# Patient Record
Sex: Male | Born: 1950 | Race: White | Hispanic: No | Marital: Married | Smoking: Never smoker
Health system: Southern US, Community
[De-identification: ages and names within clinical notes are randomized; demographics above are authoritative.]

## PROBLEM LIST (undated history)

## (undated) DIAGNOSIS — M1990 Unspecified osteoarthritis, unspecified site: Secondary | ICD-10-CM

## (undated) DIAGNOSIS — K635 Polyp of colon: Secondary | ICD-10-CM

## (undated) DIAGNOSIS — I10 Essential (primary) hypertension: Secondary | ICD-10-CM

## (undated) DIAGNOSIS — H269 Unspecified cataract: Secondary | ICD-10-CM

## (undated) DIAGNOSIS — I219 Acute myocardial infarction, unspecified: Secondary | ICD-10-CM

## (undated) DIAGNOSIS — C4492 Squamous cell carcinoma of skin, unspecified: Secondary | ICD-10-CM

## (undated) DIAGNOSIS — G4730 Sleep apnea, unspecified: Secondary | ICD-10-CM

## (undated) DIAGNOSIS — Z8614 Personal history of Methicillin resistant Staphylococcus aureus infection: Secondary | ICD-10-CM

## (undated) DIAGNOSIS — K649 Unspecified hemorrhoids: Secondary | ICD-10-CM

## (undated) HISTORY — PX: EYE SURGERY: SHX253

## (undated) HISTORY — DX: Sleep apnea, unspecified: G47.30

## (undated) HISTORY — DX: Unspecified osteoarthritis, unspecified site: M19.90

## (undated) HISTORY — DX: Unspecified cataract: H26.9

## (undated) HISTORY — DX: Unspecified hemorrhoids: K64.9

## (undated) HISTORY — DX: Essential (primary) hypertension: I10

## (undated) HISTORY — PX: KNEE SURGERY: SHX244

## (undated) HISTORY — DX: Personal history of Methicillin resistant Staphylococcus aureus infection: Z86.14

## (undated) HISTORY — DX: Squamous cell carcinoma of skin, unspecified: C44.92

## (undated) HISTORY — DX: Polyp of colon: K63.5

## (undated) HISTORY — PX: HERNIA REPAIR: SHX51

## (undated) HISTORY — PX: POLYPECTOMY: SHX149

## (undated) HISTORY — DX: Acute myocardial infarction, unspecified: I21.9

---

## 2005-04-16 HISTORY — PX: COLONOSCOPY: SHX174

## 2005-08-08 ENCOUNTER — Ambulatory Visit: Payer: Self-pay | Admitting: Internal Medicine

## 2005-08-15 ENCOUNTER — Ambulatory Visit: Payer: Self-pay | Admitting: Internal Medicine

## 2008-09-07 DIAGNOSIS — C4491 Basal cell carcinoma of skin, unspecified: Secondary | ICD-10-CM

## 2008-09-07 HISTORY — DX: Basal cell carcinoma of skin, unspecified: C44.91

## 2010-03-28 DIAGNOSIS — D239 Other benign neoplasm of skin, unspecified: Secondary | ICD-10-CM

## 2010-03-28 HISTORY — DX: Other benign neoplasm of skin, unspecified: D23.9

## 2012-08-08 ENCOUNTER — Encounter: Payer: Self-pay | Admitting: Internal Medicine

## 2014-11-12 ENCOUNTER — Ambulatory Visit
Admission: EM | Admit: 2014-11-12 | Discharge: 2014-11-12 | Disposition: A | Discharge status: Home / Self Care | Payer: Worker's Compensation | Attending: Emergency Medicine | Admitting: Emergency Medicine

## 2014-11-12 ENCOUNTER — Ambulatory Visit: Payer: Worker's Compensation

## 2014-11-12 DIAGNOSIS — S161XXA Strain of muscle, fascia and tendon at neck level, initial encounter: Secondary | ICD-10-CM | POA: Insufficient documentation

## 2014-11-12 DIAGNOSIS — M542 Cervicalgia: Secondary | ICD-10-CM | POA: Diagnosis present
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
## 2014-11-12 NOTE — Discharge Instructions (Signed)
500 mg aleve twice a day as needed. Gentle ranger of motion exercises, massage as we discussed. Return if you get worse or if not better in 6 weeks after the injury.
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
## 2015-05-23 ENCOUNTER — Telehealth: Payer: Self-pay | Admitting: Internal Medicine

## 2015-05-23 NOTE — Telephone Encounter (Signed)
Spoke with patient and he would like Dr. Loletha Carrow as his new GI. He reports rectal pain for some time now. He drives a lot and it is painful. Offered OV tomorrow with extender but he cannot come. Scheduled with Arta Bruce, PA-C on 06/10/15 at 2:45 PM. Patient understands to seek care at PCP, Urgent Care or ED for urgent needs.

## 2015-05-23 NOTE — Telephone Encounter (Signed)
Left a message for patient to call back. 

## 2015-06-09 ENCOUNTER — Encounter: Payer: Self-pay | Admitting: Gastroenterology

## 2015-06-10 ENCOUNTER — Encounter: Payer: Self-pay | Admitting: Physician Assistant

## 2015-06-10 ENCOUNTER — Ambulatory Visit (INDEPENDENT_AMBULATORY_CARE_PROVIDER_SITE_OTHER): Payer: Managed Care, Other (non HMO) | Admitting: Physician Assistant

## 2015-06-10 VITALS — BP 146/84 | Ht 73.0 in | Wt 206.0 lb

## 2015-06-10 DIAGNOSIS — K648 Other hemorrhoids: Secondary | ICD-10-CM

## 2015-06-10 DIAGNOSIS — Z86010 Personal history of colonic polyps: Secondary | ICD-10-CM

## 2015-06-10 MED ORDER — HYDROCORTISONE ACETATE 25 MG RE SUPP: 25.0000 mg | Freq: Two times a day (BID) | RECTAL | Status: DC

## 2015-06-10 NOTE — Progress Notes (Signed)
Thank you for sending this case to me. I have reviewed the entire note, and the outlined plan seems appropriate.  

## 2015-06-10 NOTE — Patient Instructions (Signed)
You have been scheduled for a colonoscopy. Please follow written instructions given to you at your visit today.  Please pick up your prep supplies at the pharmacy within the next 1-3 days. If you use inhalers (even only as needed), please bring them with you on the day of your procedure. Your physician has requested that you go to www.startemmi.com and enter the access code given to you at your visit today. This web site gives a general overview about your procedure. However, you should still follow specific instructions given to you by our office regarding your preparation for the procedure.  We sent a prescription to your pharmacy [facility] suppositories.

## 2015-06-10 NOTE — Progress Notes (Signed)
Patient ID: James Moore, male   DOB: 01-12-1951, 65 y.o.   MRN: [id number]    HPI:  James Moore is a 65 y.o.   male  Who was previously followed by Dr. Delfin Edis. He had a screening colonoscopy in May 2007 at which time an ascending  Colon polyp measuring 3 mm was found. He was noted to have internal hemorrhoids as well. Pathology of the polyp revealed benign colonic mucosa. Patient was advised to have surveillance in 10 years. He recently received a call that he is due to schedule his surveillance colonoscopy.    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 He will often have to strain to start a bowel movement. Over the last few months he has had episodes of rectal burning. He has had no bright red blood per rectum or melena. His appetite is good and his weight is been stable. He denies a family history of colon cancer or colon polyps but states his mother had Crohn's disease that required innumerable surgeries.   Past Medical History  Diagnosis Date  . Colon polyps     Past Surgical History  Procedure Laterality Date  . Knee surgery     Family History  Problem Relation Age of Onset  . Crohn's disease Mother    Social History  Substance Use Topics  . Smoking status: Never Smoker   . Smokeless tobacco: None  . Alcohol Use: Yes     Comment: social   No current outpatient prescriptions on file.   No current facility-administered medications for this visit.   No Known Allergies   Review of Systems: Gen: Denies any fever, chills, sweats, anorexia, fatigue, weakness, malaise, weight loss, and sleep disorder CV: Denies chest pain, angina, palpitations, syncope, orthopnea, PND, peripheral edema, and claudication. Resp: Denies dyspnea at rest, dyspnea with exercise, cough, sputum, wheezing, coughing up blood, and pleurisy. GI: Denies vomiting blood, jaundice, and fecal incontinence.   Denies dysphagia or  odynophagia. GU : Denies urinary burning, blood in urine, urinary frequency, urinary hesitancy, nocturnal urination, and urinary incontinence. MS: Denies joint pain, limitation of movement, and swelling, stiffness, low back pain, extremity pain. Denies muscle weakness, cramps, atrophy.  Derm: Denies rash, itching, dry skin, hives, moles, warts, or unhealing ulcers.  Psych: Denies depression, anxiety, memory loss, suicidal ideation, hallucinations, paranoia, and confusion. Heme: Denies bruising, bleeding, and enlarged lymph nodes. Neuro:  Denies any headaches, dizziness, paresthesias. Endo:  Denies any problems with DM, thyroid, adrenal function    Prior Endoscopies:   See history of present illness  Physical Exam: BP 146/84 mmHg  Ht 6\' 1"  (1.854 m)  Wt 206 lb (93.441 kg)  BMI 27.18 kg/m2 Constitutional: Pleasant,well-developed, male in no acute distress. HEENT: Normocephalic and atraumatic. Conjunctivae are normal. No scleral icterus. Neck supple.  No JVD Cardiovascular: Normal rate, regular rhythm.  Pulmonary/chest: Effort normal and breath sounds normal. No wheezing, rales or rhonchi. Abdominal: Soft, nondistended, nontender. Bowel sounds active throughout. There are no masses palpable. No hepatomegaly. Rectal:  No external hemorrhoids noted. Brown stool, Hemoccult negative. Anoscopy with internal hemorrhoids. Extremities: no edema Lymphadenopathy: No cervical adenopathy noted. Neurological: Alert and oriented to person place and time. Skin: Skin is warm and dry. No rashes noted. Psychiatric: Normal mood and affect. Behavior is normal.  ASSESSMENT AND PLAN:   65 year old male due for surveillance colonoscopy presenting with complaints of intermittent anal burning. Symptoms are  likely due to his internal hemorrhoids. He's been advised to use a stool softener as needed. He will a trial of Anusol HC suppositories 1 per rectum twice daily for 10 days. He will be scheduled for  colonoscopy to evaluate for polyps, neoplasia, IBD, etc.The risks, benefits, and alternatives to colonoscopy with possible biopsy and possible polypectomy were discussed with the patient and they consent to proceed.   Further recommendations will be made pending the findings of the above.   Ianmichael Amescua, Vita Barley PA-C 06/10/2015, 3:29 PM

## 2015-06-17 ENCOUNTER — Ambulatory Visit (INDEPENDENT_AMBULATORY_CARE_PROVIDER_SITE_OTHER): Payer: Managed Care, Other (non HMO) | Admitting: Family Medicine

## 2015-06-17 ENCOUNTER — Encounter: Payer: Self-pay | Admitting: Family Medicine

## 2015-06-17 VITALS — BP 138/82 | HR 66 | Temp 98.7°F | Resp 16 | Ht 72.0 in | Wt 210.2 lb

## 2015-06-17 DIAGNOSIS — Z8614 Personal history of Methicillin resistant Staphylococcus aureus infection: Secondary | ICD-10-CM | POA: Insufficient documentation

## 2015-06-17 DIAGNOSIS — Z23 Encounter for immunization: Secondary | ICD-10-CM | POA: Diagnosis not present

## 2015-06-17 DIAGNOSIS — M5020 Other cervical disc displacement, unspecified cervical region: Secondary | ICD-10-CM | POA: Insufficient documentation

## 2015-06-17 DIAGNOSIS — C44310 Basal cell carcinoma of skin of unspecified parts of face: Secondary | ICD-10-CM | POA: Insufficient documentation

## 2015-06-17 DIAGNOSIS — M5030 Other cervical disc degeneration, unspecified cervical region: Secondary | ICD-10-CM | POA: Insufficient documentation

## 2015-06-17 DIAGNOSIS — Z0000 Encounter for general adult medical examination without abnormal findings: Secondary | ICD-10-CM | POA: Diagnosis not present

## 2015-06-17 NOTE — Patient Instructions (Signed)
We will call you with the lab results. 

## 2015-06-17 NOTE — Progress Notes (Signed)
Subjective:     Patient ID: James Moore, male   DOB: [birth year], 65 y.o.   MRN: [id number]  HPI  Chief Complaint  Patient presents with  . Annual Exam    Patient comes into office today for his annual physical, patient would like to address rectal bleeding intermittent for the past month. He is scheduled for a colonoscopy on 3/15, after being seen by [facility] it was determined that patient has a hemrrhoid.   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   Review of Systems General: Feeling well, unknown last tetanus shot; defers Zostavax. HEENT: regular dental visits and eye exams Cardiovascular: no chest pain, shortness of breath, or palpitations Pulmonary: hx of OSA with CPAP in 2009 but lost a lot of weight and no longer needs. GI: no heartburn, no change in bowel habits. Screening colonoscopy pending GU: nocturia x0- 1, no change in bladder habits. Reports getting flaccid halfway through intercourse. Psychiatric: not depressed Musculoskeletal: no joint pain. History of left partial knee meniscectomy Integument: sees dermatology, Dr. Nehemiah Massed, on a regular basis.    Objective:   Physical Exam  Constitutional: He appears well-developed and well-nourished. No distress.  Eyes: PERRLA, EOMI Neck: no thyromegaly, tenderness or nodules, carotids without bruit.  ENT: TM's intact without inflammation; No tonsillar enlargement or exudate, Lungs: Clear Heart : RRR without murmur or gallop Abd: bowel sounds present, soft, non-tender, no organomegaly Rectal: Prostate firm and non-tender, brown stool on glove Extremities: no edema     Assessment:    1. Annual physical exam - PSA - Lipid panel - Comprehensive metabolic panel  2. Need for diphtheria-tetanus-pertussis (Tdap) vaccine, adult/adolescent - Tdap vaccine greater than or equal to 7yo IM      Plan:    Further f/u pending lab work. Consider Viagra for  erectile dysfunction.

## 2015-06-21 ENCOUNTER — Telehealth: Payer: Self-pay

## 2015-06-21 ENCOUNTER — Other Ambulatory Visit: Payer: Self-pay | Admitting: Family Medicine

## 2015-06-21 DIAGNOSIS — N529 Male erectile dysfunction, unspecified: Secondary | ICD-10-CM

## 2015-06-21 LAB — LIPID PANEL
CHOLESTEROL TOTAL: 168 mg/dL (ref 100–199)
Chol/HDL Ratio: 2.9 ratio units (ref 0.0–5.0)
HDL: 58 mg/dL (ref >39)
LDL Calculated: 97 mg/dL (ref 0–99)
TRIGLYCERIDES: 67 mg/dL (ref 0–149)
VLDL Cholesterol Cal: 13 mg/dL (ref 5–40)

## 2015-06-21 LAB — COMPREHENSIVE METABOLIC PANEL
A/G RATIO: 1.9 (ref 1.1–2.5)
ALBUMIN: 4.6 g/dL (ref 3.6–4.8)
ALT: 22 IU/L (ref 0–44)
AST: 23 IU/L (ref 0–40)
Alkaline Phosphatase: 53 IU/L (ref 39–117)
BILIRUBIN TOTAL: 0.6 mg/dL (ref 0.0–1.2)
BUN / CREAT RATIO: 12 (ref 10–22)
BUN: 15 mg/dL (ref 8–27)
CALCIUM: 9.8 mg/dL (ref 8.6–10.2)
CHLORIDE: 98 mmol/L (ref 96–106)
CO2: 27 mmol/L (ref 18–29)
Creatinine, Ser: 1.27 mg/dL (ref 0.76–1.27)
GFR, EST AFRICAN AMERICAN: 70 mL/min/{1.73_m2} (ref >59)
GFR, EST NON AFRICAN AMERICAN: 60 mL/min/{1.73_m2} (ref >59)
GLOBULIN, TOTAL: 2.4 g/dL (ref 1.5–4.5)
Glucose: 99 mg/dL (ref 65–99)
POTASSIUM: 4.8 mmol/L (ref 3.5–5.2)
Sodium: 139 mmol/L (ref 134–144)
TOTAL PROTEIN: 7 g/dL (ref 6.0–8.5)

## 2015-06-21 LAB — PSA: PROSTATE SPECIFIC AG, SERUM: 3.2 ng/mL (ref 0.0–4.0)

## 2015-06-21 MED ORDER — SILDENAFIL CITRATE 50 MG PO TABS: 50.0000 mg | ORAL_TABLET | Freq: Every day | ORAL | Status: DC | PRN

## 2015-06-21 NOTE — Telephone Encounter (Signed)
Patient has been advised. KW 

## 2015-06-21 NOTE — Telephone Encounter (Signed)
-----   Message from Carmon Ginsberg, [place] sent at 06/21/2015  7:46 AM EST ----- Labs look good. I have sent in Viagra to [facility] in [place] if you wish to try it. If 50 mg.does not work well for you, double the dose to 100 mg.at the next encounter. Let me know how this works for you.

## 2015-06-21 NOTE — Telephone Encounter (Signed)
[id number] 

## 2015-07-01 ENCOUNTER — Encounter: Payer: Self-pay | Admitting: Gastroenterology

## 2015-07-01 ENCOUNTER — Ambulatory Visit (AMBULATORY_SURGERY_CENTER): Payer: Managed Care, Other (non HMO) | Admitting: Gastroenterology

## 2015-07-01 VITALS — BP 111/77 | HR 58 | Temp 97.8°F | Resp 12 | Ht 73.0 in | Wt 206.0 lb

## 2015-07-01 DIAGNOSIS — Z1211 Encounter for screening for malignant neoplasm of colon: Secondary | ICD-10-CM | POA: Diagnosis not present

## 2015-07-01 MED ORDER — SODIUM CHLORIDE 0.9 % IV SOLN: 500.0000 mL | INTRAVENOUS | Status: DC

## 2015-07-01 NOTE — Op Note (Signed)
[facility] Patient Name: James Moore Procedure Date: 07/01/2015 12:55 PM MRN: [id number] Endoscopist: Mallie Mussel L. Loletha Carrow , MD Age: 65 Referring MD:  Date of Birth: Sep 09, 1950 Gender: Male Procedure:                Colonoscopy Indications:              Screening for colorectal malignant neoplasm Medicines:                Monitored Anesthesia Care Procedure:                Pre-Anesthesia Assessment:                           - Prior to the procedure, a History and Physical                            was performed, and patient medications and                            allergies were reviewed. The patient's tolerance of                            previous anesthesia was also reviewed. The risks                            and benefits of the procedure and the sedation                            options and risks were discussed with the patient.                            All questions were answered, and informed consent                            was obtained. Prior Anticoagulants: The patient has                            taken no previous anticoagulant or antiplatelet                            agents. ASA Grade Assessment: II - A patient with                            mild systemic disease. After reviewing the risks                            and benefits, the patient was deemed in                            satisfactory condition to undergo the procedure.                           After obtaining informed consent, the colonoscope  was passed under direct vision. Throughout the                            procedure, the patient's blood pressure, pulse, and                            oxygen saturations were monitored continuously. The                            Model CF-HQ190L [id number]) scope was introduced                            through the anus and advanced to the the cecum,                            identified by appendiceal orifice and  ileocecal                            valve. The colonoscopy was performed without                            difficulty. The patient tolerated the procedure                            well. The quality of the bowel preparation was                            excellent. The ileocecal valve, appendiceal                            orifice, and rectum were photographed. The quality                            of the bowel preparation was evaluated using the                            BBPS [facility] Bowel Preparation Scale) with scores                            of: Right Colon = 3, Transverse Colon = 3 and Left                            Colon = 3 (entire mucosa seen well with no residual                            staining, small fragments of stool or opaque                            liquid). The total BBPS score equals 9. The bowel                            preparation used was Miralax. Scope In: 1:06:30 PM Scope Out: 1:19:07 PM Scope Withdrawal Time:  0 hours 9 minutes 58 seconds  Total Procedure Duration: 0 hours 12 minutes 37 seconds  Findings:      The perianal and digital rectal examinations were normal.      The exam was otherwise without abnormality on direct and retroflexion       views.      Multiple medium-mouthed diverticula were found in the sigmoid colon. Complications:            No immediate complications. Estimated Blood Loss:     Estimated blood loss: none. Impression:               - The entire examined colon is normal on direct and                            retroflexion views.                           - No specimens collected. Recommendation:           - Patient has a contact number available for                            emergencies. The signs and symptoms of potential                            delayed complications were discussed with the                            patient. Return to normal activities tomorrow.                            Written discharge  instructions were provided to the                            patient.                           - Resume previous diet.                           - Continue present medications.                           - Repeat colonoscopy in 10 years for screening                            purposes. Procedure Code(s):        --- Professional ---                           [phone], Colonoscopy, flexible; diagnostic, including                            collection of specimen(s) by brushing or washing,                            when performed (separate procedure) CPT copyright 2016 American Medical Association. All rights reserved. Tamsin Nader L.  Loletha Carrow, MD Estill Cotta Loletha Carrow, MD 07/01/2015 1:23:34 PM Number of Addenda: 0

## 2015-07-01 NOTE — Patient Instructions (Signed)
YOU HAD AN ENDOSCOPIC PROCEDURE TODAY AT THE [facility]:   Refer to the procedure report that was given to you for any specific questions about what was found during the examination.  If the procedure report does not answer your questions, please call your gastroenterologist to clarify.  If you requested that your care partner not be given the details of your procedure findings, then the procedure report has been included in a sealed envelope for you to review at your convenience later.  YOU SHOULD EXPECT: Some feelings of bloating in the abdomen. Passage of more gas than usual.  Walking can help get rid of the air that was put into your GI tract during the procedure and reduce the bloating. If you had a lower endoscopy (such as a colonoscopy or flexible sigmoidoscopy) you may notice spotting of blood in your stool or on the toilet paper. If you underwent a bowel prep for your procedure, you may not have a normal bowel movement for a few days.  Please Note:  You might notice some irritation and congestion in your nose or some drainage.  This is from the oxygen used during your procedure.  There is no need for concern and it should clear up in a day or so.  SYMPTOMS TO REPORT IMMEDIATELY:   Following lower endoscopy (colonoscopy or flexible sigmoidoscopy):  Excessive amounts of blood in the stool  Significant tenderness or worsening of abdominal pains  Swelling of the abdomen that is new, acute  Fever of 100F or higher   For urgent or emergent issues, a gastroenterologist can be reached at any hour by calling [phone].   DIET: Your first meal following the procedure should be a small meal and then it is ok to progress to your normal diet. Heavy or fried foods are harder to digest and may make you feel nauseous or bloated.  Likewise, meals heavy in dairy and vegetables can increase bloating.  Drink plenty of fluids but you should avoid alcoholic beverages for 24  hours.  ACTIVITY:  You should plan to take it easy for the rest of today and you should NOT DRIVE or use heavy machinery until tomorrow (because of the sedation medicines used during the test).    FOLLOW UP: Our staff will call the number listed on your records the next business day following your procedure to check on you and address any questions or concerns that you may have regarding the information given to you following your procedure. If we do not reach you, we will leave a message.  However, if you are feeling well and you are not experiencing any problems, there is no need to return our call.  We will assume that you have returned to your regular daily activities without incident.  If any biopsies were taken you will be contacted by phone or by letter within the next 1-3 weeks.  Please call us at [phone] if you have not heard about the biopsies in 3 weeks.    SIGNATURES/CONFIDENTIALITY: You and/or your care partner have signed paperwork which will be entered into your electronic medical record.  These signatures attest to the fact that that the information above on your After Visit Summary has been reviewed and is understood.  Full responsibility of the confidentiality of this discharge information lies with you and/or your care-partner.    Handouts were given to your care partner on  diverticulosis and a high fiber diet with liberal fluid intake You may resume   current medications today. Please call if any questions or concerns.

## 2015-07-01 NOTE — Progress Notes (Signed)
Report to PACU, RN, vss, BBS= Clear.  

## 2015-07-01 NOTE — Progress Notes (Signed)
No egg or soy allergy known to patient  No issues with past sedation with any surgeries  or procedures, no intubation problems  No diet pills per patient No home 02 use per patient  No blood thinners per patient  Pt denies issues with constipation   

## 2015-07-01 NOTE — Progress Notes (Signed)
No problems noted in the recovery room. maw 

## 2015-07-04 ENCOUNTER — Telehealth: Payer: Self-pay

## 2015-07-04 NOTE — Telephone Encounter (Signed)
  Follow up Call-  Call back number 07/01/2015  Post procedure Call Back phone  # [phone]  Permission to leave phone message Yes     Patient questions:  Do you have a fever, pain , or abdominal swelling? No. Pain Score  0 *  Have you tolerated food without any problems? Yes.    Have you been able to return to your normal activities? Yes.    Do you have any questions about your discharge instructions: Diet   No. Medications  No. Follow up visit  No.  Do you have questions or concerns about your Care? No.  Actions: * If pain score is 4 or above: No action needed, pain <4.  No problems per the pt. maw

## 2016-09-07 ENCOUNTER — Encounter: Payer: Managed Care, Other (non HMO) | Admitting: Family Medicine

## 2017-03-21 ENCOUNTER — Telehealth: Payer: Self-pay | Admitting: *Deleted

## 2017-03-21 NOTE — Telephone Encounter (Signed)
Pt is scheduled for CPE with Mikki Santee 03/22/17. Thanks TNP

## 2017-03-21 NOTE — Telephone Encounter (Signed)
LMOVM for pt to return call. Patient is due for CPE.

## 2017-03-22 ENCOUNTER — Ambulatory Visit
Admission: RE | Admit: 2017-03-22 | Discharge: 2017-03-22 | Disposition: A | Discharge status: Home / Self Care | Payer: Managed Care, Other (non HMO) | Source: Ambulatory Visit | Attending: Family Medicine | Admitting: Family Medicine

## 2017-03-22 ENCOUNTER — Ambulatory Visit (INDEPENDENT_AMBULATORY_CARE_PROVIDER_SITE_OTHER): Payer: Managed Care, Other (non HMO) | Admitting: Family Medicine

## 2017-03-22 ENCOUNTER — Encounter: Payer: Self-pay | Admitting: Family Medicine

## 2017-03-22 VITALS — BP 146/94 | HR 68 | Temp 98.0°F | Resp 16 | Ht 73.0 in | Wt 215.2 lb

## 2017-03-22 DIAGNOSIS — M79602 Pain in left arm: Secondary | ICD-10-CM | POA: Diagnosis present

## 2017-03-22 DIAGNOSIS — M5412 Radiculopathy, cervical region: Secondary | ICD-10-CM | POA: Diagnosis not present

## 2017-03-22 DIAGNOSIS — Z125 Encounter for screening for malignant neoplasm of prostate: Secondary | ICD-10-CM | POA: Diagnosis not present

## 2017-03-22 DIAGNOSIS — M4722 Other spondylosis with radiculopathy, cervical region: Secondary | ICD-10-CM | POA: Diagnosis not present

## 2017-03-22 DIAGNOSIS — Z0000 Encounter for general adult medical examination without abnormal findings: Secondary | ICD-10-CM

## 2017-03-22 DIAGNOSIS — M792 Neuralgia and neuritis, unspecified: Secondary | ICD-10-CM | POA: Diagnosis present

## 2017-03-22 LAB — COMPLETE METABOLIC PANEL WITH GFR
AG RATIO: 1.8 (calc) (ref 1.0–2.5)
ALKALINE PHOSPHATASE (APISO): 45 U/L (ref 40–115)
ALT: 24 U/L (ref 9–46)
AST: 20 U/L (ref 10–35)
Albumin: 4.5 g/dL (ref 3.6–5.1)
BUN: 14 mg/dL (ref 7–25)
CO2: 30 mmol/L (ref 20–32)
Calcium: 10 mg/dL (ref 8.6–10.3)
Chloride: 102 mmol/L (ref 98–110)
Creat: 1.04 mg/dL (ref 0.70–1.25)
GFR, Est African American: 88 mL/min/{1.73_m2} (ref >=60)
GFR, Est Non African American: 76 mL/min/{1.73_m2} (ref >=60)
GLOBULIN: 2.5 g/dL (ref 1.9–3.7)
Glucose, Bld: 76 mg/dL (ref 65–99)
POTASSIUM: 4 mmol/L (ref 3.5–5.3)
SODIUM: 140 mmol/L (ref 135–146)
Total Bilirubin: 0.6 mg/dL (ref 0.2–1.2)
Total Protein: 7 g/dL (ref 6.1–8.1)

## 2017-03-22 LAB — PSA: PSA: 2.8 ng/mL (ref <=4.0)

## 2017-03-22 LAB — LIPID PANEL
CHOLESTEROL: 177 mg/dL (ref <200)
HDL: 55 mg/dL (ref >40)
LDL Cholesterol (Calc): 99 mg/dL (calc)
Non-HDL Cholesterol (Calc): 122 mg/dL (calc) (ref <130)
TRIGLYCERIDES: 132 mg/dL (ref <150)
Total CHOL/HDL Ratio: 3.2 (calc) (ref <5.0)

## 2017-03-22 NOTE — Patient Instructions (Signed)
We will call you with the x-ray results and lab results.

## 2017-03-22 NOTE — Progress Notes (Signed)
Subjective:     Patient ID: James Moore, male   DOB: [birth year], 66 y.o.   MRN: [id number] Chief Complaint  Patient presents with  . Annual Exam    Patient comes in office today for his annual physical he states that he feels well and has no complaints or concerns to address. Patient states that he follows a well balanced diet, exercises 2x a week and sleeps on average 7-8hrs a night. Paitents last reported Tdap was 06/17/15, PSA 06/20/15, Colonoscopy 07/01/15/. Patient has declined Flu, Shingrix and Pneumovax today.    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 States he sees dermatology, Dr. Nehemiah Massed, and a chiropractor regularly.  Review of Systems General: Feeling well, defers immunizations HEENT: regular dental visits and is pending eye exam. Cardiovascular: no chest pain, shortness of breath, or palpitations. States home blood pressures are in 130/80 range. GI: occasional heartburn, no change in bowel habits or blood in the stool GU: nocturia x 0- 1, no change in bladder habits  Psychiatric: not depressed Musculoskeletal: prior left knee menisceal surgery. Reports left neck burning pain for the last 6 months radiating down to his first 3 fingers. Attributes it to driving a lot in his job. Wishes a c-spine x-ray.    Objective:   Physical Exam  Constitutional: He appears well-developed and well-nourished. No distress.  Eyes: PERRLA, EOMI Neck: no thyromegaly, tenderness or nodules, no cervical adenopathy or carotid bruits. ENT: TM's intact without inflammation; No tonsillar enlargement or exudate, Lungs: Clear Heart : RRR without murmur or gallop Abd: bowel sounds present, soft, non-tender, no organomegaly Rectal: Prostate firm and non-tender Extremities: no edema Skin: no atypical lesions noted on his back     Assessment:    1. Annual physical exam - COMPLETE METABOLIC PANEL WITH GFR - Lipid panel  2. Screening for prostate  cancer - PSA  3. Cervical radiculopathy - DG Cervical Spine Complete; Future    Plan:    Further f/u pending x-ray and lab results.

## 2018-01-10 ENCOUNTER — Ambulatory Visit
Admission: RE | Admit: 2018-01-10 | Discharge: 2018-01-10 | Disposition: A | Discharge status: Home / Self Care | Payer: Managed Care, Other (non HMO) | Source: Ambulatory Visit | Attending: Family Medicine | Admitting: Family Medicine

## 2018-01-10 ENCOUNTER — Encounter: Payer: Self-pay | Admitting: Family Medicine

## 2018-01-10 ENCOUNTER — Ambulatory Visit: Payer: Managed Care, Other (non HMO) | Admitting: Family Medicine

## 2018-01-10 VITALS — BP 118/88 | HR 67 | Temp 97.5°F | Resp 16 | Wt 210.0 lb

## 2018-01-10 DIAGNOSIS — M7122 Synovial cyst of popliteal space [Baker], left knee: Secondary | ICD-10-CM | POA: Insufficient documentation

## 2018-01-10 DIAGNOSIS — M7989 Other specified soft tissue disorders: Secondary | ICD-10-CM | POA: Diagnosis present

## 2018-01-10 DIAGNOSIS — M79605 Pain in left leg: Secondary | ICD-10-CM | POA: Insufficient documentation

## 2018-01-10 DIAGNOSIS — R0609 Other forms of dyspnea: Secondary | ICD-10-CM

## 2018-01-10 DIAGNOSIS — Z23 Encounter for immunization: Secondary | ICD-10-CM

## 2018-01-10 DIAGNOSIS — K429 Umbilical hernia without obstruction or gangrene: Secondary | ICD-10-CM

## 2018-01-10 NOTE — Progress Notes (Signed)
Patient: James Moore Male    DOB: Feb 01, 1951   67 y.o.   MRN: [id number] Visit Date: 01/10/2018  Today's Provider: Lelon Huh, MD   Chief Complaint  Patient presents with  . Leg Pain    x 3-4 months   Subjective:    Leg Pain   Incident onset: 3-4 months ago. There was no injury mechanism. The pain is present in the left leg. The quality of the pain is described as aching.  Patient also described the pain as a stiffness in his leg. He has had some swelling in the left leg. States was much more swollen earlier, mostly behind left knee but up into posterior thigh as well. No known injuries..   He does report that he does sometimes have shortness of breath on exertion and feels like it starts down in the sides of his abdomen and works up, but not associated with chest pain or palpations. State he does not always feel short of breath, but that it sometimes occurs during intercourse to the point he has to stop. No associated abdominal pain.    He also states he has been having intermittent pains and cramping around umbilicus for several months. He has umbilical hernia which he feels is the cause of these symptoms and would like to go ahead and see surgeon to discuss repair.  No Known Allergies  No current outpatient medications on file.  Review of Systems  Constitutional: Negative for appetite change, chills and fever.  Respiratory: Negative for chest tightness, shortness of breath and wheezing.   Cardiovascular: Positive for leg swelling. Negative for chest pain and palpitations.  Gastrointestinal: Negative for abdominal pain, nausea and vomiting.  Musculoskeletal: Positive for myalgias (left leg).    Social History   Tobacco Use  . Smoking status: Never Smoker  . Smokeless tobacco: Never Used  Substance Use Topics  . Alcohol use: Not Currently    Alcohol/week: 0.0 standard drinks   Objective:   BP 118/88 (BP Location: Left Arm, Patient Position: Sitting, Cuff  Size: Large)   Pulse 67   Temp (!) 97.5 F (36.4 C) (Oral)   Resp 16   Wt 210 lb (95.3 kg)   SpO2 99% Comment: room air  BMI 27.71 kg/m  Vitals:   01/10/18 1148  BP: 118/88  Pulse: 67  Resp: 16  Temp: (!) 97.5 F (36.4 C)  TempSrc: Oral  SpO2: 99%  Weight: 210 lb (95.3 kg)     Physical Exam   General Appearance:    Alert, cooperative, no distress  Eyes:    PERRL, conjunctiva/corneas clear, EOM's intact       Lungs:     Clear to auscultation bilaterally, respirations unlabored  Heart:    Regular rate and rhythm  Abd:   Medium sized slightly tender, easily reducible umbilical hernia to the right of midline.   MS:   Slightly swelling posterior thigh and popliteal fossae. Extensive varicosities noted. No cords.       Doppler u/s left LE reveals bakers cyst, no thrombosis.     Assessment & Plan:     1. Left leg swelling No DVT per- US Venous Img Lower Unilateral Left; Future  2. DOE (dyspnea on exertion) Very non-specific and rather vague description.  - CBC - Troponin I - Brain natriuretic peptide - D-Dimer, Quantitative  3. Baker cyst, left Reassurance.        Lelon Huh, MD  [facility]

## 2018-01-11 LAB — D-DIMER, QUANTITATIVE: D-DIMER: 0.28 mg/L FEU (ref 0.00–0.49)

## 2018-01-11 LAB — BRAIN NATRIURETIC PEPTIDE: BNP: 36.3 pg/mL (ref 0.0–100.0)

## 2018-01-11 LAB — TROPONIN I: Troponin I: <0.01 ng/mL (ref 0.00–0.04)

## 2018-01-11 LAB — CBC
Hematocrit: 42.6 % (ref 37.5–51.0)
Hemoglobin: 14.8 g/dL (ref 13.0–17.7)
MCH: 29.1 pg (ref 26.6–33.0)
MCHC: 34.7 g/dL (ref 31.5–35.7)
MCV: 84 fL (ref 79–97)
PLATELETS: 222 10*3/uL (ref 150–450)
RBC: 5.09 x10E6/uL (ref 4.14–5.80)
RDW: 12.7 % (ref 12.3–15.4)
WBC: 6.1 10*3/uL (ref 3.4–10.8)

## 2018-01-13 ENCOUNTER — Telehealth: Payer: Self-pay

## 2018-01-13 NOTE — Telephone Encounter (Signed)
-----   Message from Birdie Sons, MD sent at 01/12/2018  5:43 PM EDT ----- Labs all completely normal. No sign of any heart problems, lung problems, or any type of blood clot. Recommend he follow up with Dr. Rosanna Randy regard shortness of breath.

## 2018-01-13 NOTE — Telephone Encounter (Signed)
Pt returned missed call.  Please call pt back. ° °Thanks, °[facility] °

## 2018-01-13 NOTE — Telephone Encounter (Signed)
Patient advised. He prefers to hold off on scheduling a follow up appointment at this time. He states he was told he had a baker's cyst.

## 2018-01-13 NOTE — Telephone Encounter (Signed)
LMTCB 01/13/2018  Thanks,   -Mickel Baas

## 2019-02-06 ENCOUNTER — Telehealth: Payer: Self-pay | Admitting: Family Medicine

## 2019-02-06 ENCOUNTER — Encounter: Payer: Self-pay | Admitting: Physician Assistant

## 2019-02-06 NOTE — Telephone Encounter (Signed)
Pt needing a CPE before the end of the year w/ [facility].  Please call pt back at [phone] to schedule an appt with him.  He was too late for his CPE on 02-06-19 w/ Adriana. Could not be seen.  There is nothing available on my end.  Thanks, [employer]

## 2019-02-06 NOTE — Telephone Encounter (Signed)
Patient called clinic at 1:20 PM asking if his appointment was at 2:00 PM. Patient was contacted day prior for pre screening. Patient was asked if he could be seen if he showed up in 15-20 minutes and he was advised he could not. He should reschedule.

## 2019-02-15 DIAGNOSIS — U071 COVID-19: Secondary | ICD-10-CM

## 2019-02-15 HISTORY — DX: COVID-19: U07.1

## 2019-02-17 ENCOUNTER — Telehealth: Payer: Self-pay | Admitting: Family Medicine

## 2019-02-17 DIAGNOSIS — K429 Umbilical hernia without obstruction or gangrene: Secondary | ICD-10-CM

## 2019-02-17 NOTE — Telephone Encounter (Signed)
Patient is requesting a referral to general surgery for a umbilical hernia. Last referral has been over one year ago.

## 2019-02-17 NOTE — Telephone Encounter (Signed)
Pt calling regarding the referral Mikki Santee gave him last year to help repair his umbilical tear above his navel. He wants to check where the referral was made and if it would be in his insurance network.  Please advise.  Thanks, [employer]

## 2019-02-18 NOTE — Telephone Encounter (Signed)
Referral has been ordered, he should get a call from [place] or surgery practice in the next couple of days.

## 2019-02-18 NOTE — Telephone Encounter (Signed)
Patient is calling back office to get update on referral, patient states that he is trying to get this done as soon as possible. Please advise. KW

## 2019-02-18 NOTE — Telephone Encounter (Signed)
Patient advised. Please schedule.  

## 2019-02-20 ENCOUNTER — Other Ambulatory Visit: Payer: Self-pay

## 2019-02-20 ENCOUNTER — Encounter: Payer: Self-pay | Admitting: Surgery

## 2019-02-20 ENCOUNTER — Ambulatory Visit (INDEPENDENT_AMBULATORY_CARE_PROVIDER_SITE_OTHER): Payer: Managed Care, Other (non HMO) | Admitting: Surgery

## 2019-02-20 VITALS — BP 138/93 | HR 69 | Temp 97.5°F | Ht 72.0 in | Wt 208.0 lb

## 2019-02-20 DIAGNOSIS — K429 Umbilical hernia without obstruction or gangrene: Secondary | ICD-10-CM

## 2019-02-20 NOTE — Patient Instructions (Signed)
Our surgery scheduler will call you to schedule your surgery. Please have the [facility] surgery sheet available when speaking with her.  Umbilical Hernia, Adult  A hernia is a bulge of tissue that pushes through an opening between muscles. An umbilical hernia happens in the abdomen, near the belly button (umbilicus). The hernia may contain tissues from the small intestine, large intestine, or fatty tissue covering the intestines (omentum). Umbilical hernias in adults tend to get worse over time, and they require surgical treatment. There are several types of umbilical hernias. You may have:  A hernia located just above or below the umbilicus (indirect hernia). This is the most common type of umbilical hernia in adults.  A hernia that forms through an opening formed by the umbilicus (direct hernia).  A hernia that comes and goes (reducible hernia). A reducible hernia may be visible only when you strain, lift something heavy, or cough. This type of hernia can be pushed back into the abdomen (reduced).  A hernia that traps abdominal tissue inside the hernia (incarcerated hernia). This type of hernia cannot be reduced.  A hernia that cuts off blood flow to the tissues inside the hernia (strangulated hernia). The tissues can start to die if this happens. This type of hernia requires emergency treatment. What are the causes? An umbilical hernia happens when tissue inside the abdomen presses on a weak area of the abdominal muscles. What increases the risk? You may have a greater risk of this condition if you:  Are obese.  Have had several pregnancies.  Have a buildup of fluid inside your abdomen (ascites).  Have had surgery that weakens the abdominal muscles. What are the signs or symptoms? The main symptom of this condition is a painless bulge at or near the belly button. A reducible hernia may be visible only when you strain, lift something heavy, or cough. Other symptoms may include:  Dull pain.   A feeling of pressure. Symptoms of a strangulated hernia may include:  Pain that gets increasingly worse.  Nausea and vomiting.  Pain when pressing on the hernia.  Skin over the hernia becoming red or purple.  Constipation.  Blood in the stool. How is this diagnosed? This condition may be diagnosed based on:  A physical exam. You may be asked to cough or strain while standing. These actions increase the pressure inside your abdomen and force the hernia through the opening in your muscles. Your health care provider may try to reduce the hernia by pressing on it.  Your symptoms and medical history. How is this treated? Surgery is the only treatment for an umbilical hernia. Surgery for a strangulated hernia is done as soon as possible. If you have a small hernia that is not incarcerated, you may need to lose weight before having surgery. Follow these instructions at home:  Lose weight, if told by your health care provider.  Do not try to push the hernia back in.  Watch your hernia for any changes in color or size. Tell your health care provider if any changes occur.  You may need to avoid activities that increase pressure on your hernia.  Do not lift anything that is heavier than 10 lb (4.5 kg) until your health care provider says that this is safe.  Take over-the-counter and prescription medicines only as told by your health care provider.  Keep all follow-up visits as told by your health care provider. This is important. Contact a health care provider if:  Your hernia gets larger.  Your hernia becomes painful. Get help right away if:  You develop sudden, severe pain near the area of your hernia.  You have pain as well as nausea or vomiting.  You have pain and the skin over your hernia changes color.  You develop a fever. This information is not intended to replace advice given to you by your health care provider. Make sure you discuss any questions you have with  your health care provider. Document Released: 09/02/2015 Document Revised: 05/15/2017 Document Reviewed: 10/01/2016 Elsevier Patient Education  2020 [employer].

## 2019-02-20 NOTE — H&P (View-Only) (Signed)
02/20/2019  Reason for Visit:  Umbilical hernia.  Referring Provider:  Lelon Huh, MD  History of Present Illness: James Moore is a 68 y.o. male presenting for evaluation of an umbilical hernia.  Patient reports he's had this for about 2 years.  He does not remember the exact time, but believes this happened as a result of heavy lifting related to his job.  Reports that the hernia had not really been giving him much issues at first, but more recently he's reporting some burning sensation and discomfort.  Does not think the hernia has grown in size, but reports he feels sometimes the bulging can get bigger.  It has always been reducible particularly when he lies down.  Denies any fevers, chills, nausea, vomiting, constipation, distention.   Denies any abdominal surgeries.  Past Medical History: Past Medical History:  Diagnosis Date  . Colon polyps   . Hemorrhoid    prn otc hem.cream  . History of MRSA infection   . Sleep apnea    lost weight so dc'd cpap     Past Surgical History: Past Surgical History:  Procedure Laterality Date  . COLONOSCOPY  2007  . KNEE SURGERY Left    2005-2006- meniscus repair  . POLYPECTOMY      Home Medications: Prior to Admission medications   Medication Sig Start Date End Date Taking? Authorizing Provider  terbinafine (LAMISIL) 250 MG tablet Take 250 mg by mouth daily. 10/26/18  Yes [provider]    Allergies: No Known Allergies  Social History:  reports that he has never smoked. He has never used smokeless tobacco. He reports previous alcohol use. He reports that he does not use drugs.   Family History: Family History  Problem Relation Age of Onset  . Crohn's disease Mother   . Colon polyps Mother   . Dementia Father   . Colon cancer Neg Hx   . Rectal cancer Neg Hx   . Stomach cancer Neg Hx     Review of Systems: Review of Systems  Constitutional: Negative for chills and fever.  HENT: Negative for hearing loss.    Eyes: Negative for blurred vision.  Respiratory: Negative for shortness of breath.   Cardiovascular: Negative for chest pain.  Gastrointestinal: Positive for abdominal pain. Negative for blood in stool, constipation, diarrhea, nausea and vomiting.  Genitourinary: Negative for dysuria.  Musculoskeletal: Negative for myalgias.  Skin: Negative for rash.  Neurological: Negative for dizziness.  Psychiatric/Behavioral: Negative for depression.    Physical Exam BP (!) 138/93   Pulse 69   Temp (!) 97.5 F (36.4 C) (Temporal)   Ht 6' (1.829 m)   Wt 208 lb (94.3 kg)   SpO2 99%   BMI 28.21 kg/m  CONSTITUTIONAL: No acute distress HEENT:  Normocephalic, atraumatic, extraocular motion intact. NECK: Trachea is midline, and there is no jugular venous distension.  RESPIRATORY:  Lungs are clear, and breath sounds are equal bilaterally. Normal respiratory effort without pathologic use of accessory muscles. CARDIOVASCULAR: Heart is regular without murmurs, gallops, or rubs. GI: The abdomen is soft, non-distended, non-tender to palpation.  Patient has an easily reducible umbilical hernia, with defect size about 1.5 cm. No inguinal hernias palpable.   MUSCULOSKELETAL:  Normal muscle strength and tone in all four extremities.  No peripheral edema or cyanosis. SKIN: Skin turgor is normal. There are no pathologic skin lesions.  NEUROLOGIC:  Motor and sensation is grossly normal.  Cranial nerves are grossly intact. PSYCH:  Alert and oriented to person,  place and time. Affect is normal.  Laboratory Analysis: No results found for this or any previous visit (from the past 24 hour(s)).  Imaging: No results found.  Assessment and Plan: This is a 68 y.o. male with 2 year history of reducible umbilical hernia.  Discussed with the patient that we can do a umbilical hernia repair for him.  Discussed the risks of bleeding, infection, and injury to surrounding structures.  Discussed with him the potential for  mesh placement based on the true size of the hernia defect during the surgery.  This would be an outpatient procedure.  Discussed post-op restriction of no heavy lifting or pushing of no more than 10-15 lbs for 6 weeks to allow for the hernia repair to heal well.  Based on his schedule, we will schedule him for surgery on 03/19/19.  He is aware that he would need to get a COVID test before surgery.  Face-to-face time spent with the patient and care providers was 60 minutes, with more than 50% of the time spent counseling, educating, and coordinating care of the patient.     Melvyn Neth, [facility]

## 2019-02-20 NOTE — Progress Notes (Signed)
02/20/2019  Reason for Visit:  Umbilical hernia.  Referring Provider:  Lelon Huh, MD  History of Present Illness: James Moore is a 68 y.o. male presenting for evaluation of an umbilical hernia.  Patient reports he's had this for about 2 years.  He does not remember the exact time, but believes this happened as a result of heavy lifting related to his job.  Reports that the hernia had not really been giving him much issues at first, but more recently he's reporting some burning sensation and discomfort.  Does not think the hernia has grown in size, but reports he feels sometimes the bulging can get bigger.  It has always been reducible particularly when he lies down.  Denies any fevers, chills, nausea, vomiting, constipation, distention.   Denies any abdominal surgeries.  Past Medical History: Past Medical History:  Diagnosis Date  . Colon polyps   . Hemorrhoid    prn otc hem.cream  . History of MRSA infection   . Sleep apnea    lost weight so dc'd cpap     Past Surgical History: Past Surgical History:  Procedure Laterality Date  . COLONOSCOPY  2007  . KNEE SURGERY Left    2005-2006- meniscus repair  . POLYPECTOMY      Home Medications: Prior to Admission medications   Medication Sig Start Date End Date Taking? Authorizing Provider  terbinafine (LAMISIL) 250 MG tablet Take 250 mg by mouth daily. 10/26/18  Yes [provider]    Allergies: No Known Allergies  Social History:  reports that he has never smoked. He has never used smokeless tobacco. He reports previous alcohol use. He reports that he does not use drugs.   Family History: Family History  Problem Relation Age of Onset  . Crohn's disease Mother   . Colon polyps Mother   . Dementia Father   . Colon cancer Neg Hx   . Rectal cancer Neg Hx   . Stomach cancer Neg Hx     Review of Systems: Review of Systems  Constitutional: Negative for chills and fever.  HENT: Negative for hearing loss.    Eyes: Negative for blurred vision.  Respiratory: Negative for shortness of breath.   Cardiovascular: Negative for chest pain.  Gastrointestinal: Positive for abdominal pain. Negative for blood in stool, constipation, diarrhea, nausea and vomiting.  Genitourinary: Negative for dysuria.  Musculoskeletal: Negative for myalgias.  Skin: Negative for rash.  Neurological: Negative for dizziness.  Psychiatric/Behavioral: Negative for depression.    Physical Exam BP (!) 138/93   Pulse 69   Temp (!) 97.5 F (36.4 C) (Temporal)   Ht 6' (1.829 m)   Wt 208 lb (94.3 kg)   SpO2 99%   BMI 28.21 kg/m  CONSTITUTIONAL: No acute distress HEENT:  Normocephalic, atraumatic, extraocular motion intact. NECK: Trachea is midline, and there is no jugular venous distension.  RESPIRATORY:  Lungs are clear, and breath sounds are equal bilaterally. Normal respiratory effort without pathologic use of accessory muscles. CARDIOVASCULAR: Heart is regular without murmurs, gallops, or rubs. GI: The abdomen is soft, non-distended, non-tender to palpation.  Patient has an easily reducible umbilical hernia, with defect size about 1.5 cm. No inguinal hernias palpable.   MUSCULOSKELETAL:  Normal muscle strength and tone in all four extremities.  No peripheral edema or cyanosis. SKIN: Skin turgor is normal. There are no pathologic skin lesions.  NEUROLOGIC:  Motor and sensation is grossly normal.  Cranial nerves are grossly intact. PSYCH:  Alert and oriented to person,  place and time. Affect is normal.  Laboratory Analysis: No results found for this or any previous visit (from the past 24 hour(s)).  Imaging: No results found.  Assessment and Plan: This is a 68 y.o. male with 2 year history of reducible umbilical hernia.  Discussed with the patient that we can do a umbilical hernia repair for him.  Discussed the risks of bleeding, infection, and injury to surrounding structures.  Discussed with him the potential for  mesh placement based on the true size of the hernia defect during the surgery.  This would be an outpatient procedure.  Discussed post-op restriction of no heavy lifting or pushing of no more than 10-15 lbs for 6 weeks to allow for the hernia repair to heal well.  Based on his schedule, we will schedule him for surgery on 03/19/19.  He is aware that he would need to get a COVID test before surgery.  Face-to-face time spent with the patient and care providers was 60 minutes, with more than 50% of the time spent counseling, educating, and coordinating care of the patient.     Melvyn Neth, [facility]

## 2019-02-23 ENCOUNTER — Telehealth: Payer: Self-pay | Admitting: Surgery

## 2019-02-23 NOTE — Telephone Encounter (Signed)
Pt has been advised of pre admission date/time, Covid Testing date and Surgery date.  Surgery Date: 03/19/19 with Dr Yong Channel hernia repair.  Preadmission Testing Date: 03/11/19 @ 10:15am-office interview-pt advised to arrive at [facility].  Covid Testing Date: 03/16/19 between 8-10:30am - patient advised to go to the [facility]  [employer] Video sent via [employer] [facility] and [employer].  Patient has been made aware to call [phone], between 1-3:00pm the day before surgery, to find out what time to arrive.

## 2019-02-24 ENCOUNTER — Ambulatory Visit: Payer: Managed Care, Other (non HMO) | Admitting: General Surgery

## 2019-02-26 ENCOUNTER — Ambulatory Visit: Payer: Managed Care, Other (non HMO) | Admitting: General Surgery

## 2019-02-26 ENCOUNTER — Other Ambulatory Visit: Payer: Self-pay

## 2019-02-26 ENCOUNTER — Encounter: Payer: Self-pay | Admitting: Physician Assistant

## 2019-02-26 ENCOUNTER — Telehealth (INDEPENDENT_AMBULATORY_CARE_PROVIDER_SITE_OTHER): Payer: Managed Care, Other (non HMO) | Admitting: Physician Assistant

## 2019-02-26 VITALS — BP 156/90

## 2019-02-26 DIAGNOSIS — U071 COVID-19: Secondary | ICD-10-CM | POA: Diagnosis not present

## 2019-02-26 MED ORDER — ALBUTEROL SULFATE HFA 108 (90 BASE) MCG/ACT IN AERS: 2.0000 | INHALATION_SPRAY | Freq: Four times a day (QID) | RESPIRATORY_TRACT | 0 refills | Status: DC | PRN

## 2019-02-26 MED ORDER — BENZONATATE 200 MG PO CAPS: 200.0000 mg | ORAL_CAPSULE | Freq: Three times a day (TID) | ORAL | 0 refills | Status: DC | PRN

## 2019-02-26 MED ORDER — PREDNISONE 10 MG (21) PO TBPK: ORAL_TABLET | ORAL | 0 refills | Status: DC

## 2019-02-26 NOTE — Progress Notes (Signed)
Patient: James Moore Male    DOB: [birth year]   68 y.o.   MRN: [id number] Visit Date: 02/26/2019  Today's Provider: Mar Daring, PA-C   Chief Complaint  Patient presents with  . Cough   Subjective:     Virtual Visit via Video Note  I connected with James Moore on 02/26/19 at  2:00 PM EST by a video enabled telemedicine application and verified that I am speaking with the correct person using two identifiers.  Location: Patient: home Provider: [facility]   I discussed the limitations of evaluation and management by telemedicine and the availability of in person appointments. The patient expressed understanding and agreed to proceed.  Cough This is a new problem. Episode onset: 2 days ago. The problem has been unchanged. The cough is non-productive. Associated symptoms include postnasal drip and a sore throat (scratchy throat not sore). Pertinent negatives include no chest pain, chills, ear pain, fever, headaches, myalgias, rhinorrhea, shortness of breath or wheezing.    Patient with c/o tested positive for Covid-19. Test was done on Monday 02/23/2019 got the results today has a cough X 2 days. Patient did have a headache and slight increase of temperature over the weekend, but never feverish. Did not think he had covid or was sick. Was being tested for pre-op for cataract surgery.   No Known Allergies   Current Outpatient Medications:  .  terbinafine (LAMISIL) 250 MG tablet, Take 250 mg by mouth daily., Disp: , Rfl:  .  albuterol (VENTOLIN HFA) 108 (90 Base) MCG/ACT inhaler, Inhale 2 puffs into the lungs every 6 (six) hours as needed for wheezing or shortness of breath., Disp: 6.7 g, Rfl: 0 .  benzonatate (TESSALON) 200 MG capsule, Take 1 capsule (200 mg total) by mouth 3 (three) times daily as needed., Disp: 30 capsule, Rfl: 0 .  predniSONE (STERAPRED UNI-PAK 21 TAB) 10 MG (21) TBPK tablet, 6 day taper; take as directed on package instructions, Disp: 21 tablet, Rfl: 0  Review of Systems  Constitutional: Negative for appetite change, chills and fever.  HENT: Positive for postnasal drip and sore throat (scratchy throat not sore). Negative for congestion, ear pain, rhinorrhea, sinus pressure, sinus pain and trouble swallowing.   Respiratory: Positive for cough (dry). Negative for chest tightness, shortness of breath and wheezing.   Cardiovascular: Negative for chest pain and palpitations.  Gastrointestinal: Negative for abdominal pain, nausea and vomiting.  Musculoskeletal: Negative for myalgias.  Neurological: Negative for dizziness and headaches.    Social History   Tobacco Use  . Smoking status: Never Smoker  . Smokeless tobacco: Never Used  Substance Use Topics  . Alcohol use: Not Currently    Alcohol/week: 0.0 standard drinks      Objective:   BP (!) 156/90  Vitals:   02/26/19 1101  BP: (!) 156/90  There is no height or weight on file to calculate BMI.   Physical Exam Vitals signs reviewed.  Constitutional:      General: He is not in acute distress. Pulmonary:     Effort: Pulmonary effort is normal. No respiratory distress.  Neurological:     Mental Status: He is alert.      No results found for any visits on 02/26/19.     Assessment & Plan     1. COVID-19 Will treat with prednisone, tessalon perles and albuterol as below for cough and chest tightness. Continue to push fluids. Continue isolation protocols as directed by the health  department. Call if symptoms worsen.  - predniSONE (STERAPRED UNI-PAK 21 TAB) 10 MG (21) TBPK tablet; 6 day taper; take as directed on package instructions  Dispense: 21 tablet; Refill: 0 - benzonatate (TESSALON) 200 MG capsule; Take 1 capsule (200 mg total) by mouth 3 (three) times daily as needed.  Dispense: 30 capsule; Refill: 0 - albuterol (VENTOLIN HFA) 108 (90 Base) MCG/ACT inhaler; Inhale 2 puffs into the lungs every 6 (six) hours as needed for wheezing or shortness of breath.  Dispense: 6.7 g;  Refill: 0   I discussed the assessment and treatment plan with the patient. The patient was provided an opportunity to ask questions and all were answered. The patient agreed with the plan and demonstrated an understanding of the instructions.   The patient was advised to call back or seek an in-person evaluation if the symptoms worsen or if the condition fails to improve as anticipated.  I provided 14 minutes of non-face-to-face time during this encounter.    Mar Daring, PA-C  [facility]

## 2019-02-27 ENCOUNTER — Encounter: Payer: Managed Care, Other (non HMO) | Admitting: Physician Assistant

## 2019-03-01 ENCOUNTER — Encounter: Payer: Self-pay | Admitting: Physician Assistant

## 2019-03-02 ENCOUNTER — Other Ambulatory Visit: Payer: Self-pay

## 2019-03-02 ENCOUNTER — Emergency Department: Payer: Managed Care, Other (non HMO)

## 2019-03-02 ENCOUNTER — Emergency Department
Admission: EM | Admit: 2019-03-02 | Discharge: 2019-03-02 | Disposition: A | Discharge status: Home / Self Care | Payer: Managed Care, Other (non HMO) | Attending: Emergency Medicine | Admitting: Emergency Medicine

## 2019-03-02 DIAGNOSIS — C44319 Basal cell carcinoma of skin of other parts of face: Secondary | ICD-10-CM | POA: Diagnosis not present

## 2019-03-02 DIAGNOSIS — R509 Fever, unspecified: Secondary | ICD-10-CM | POA: Diagnosis present

## 2019-03-02 DIAGNOSIS — U071 COVID-19: Secondary | ICD-10-CM | POA: Diagnosis not present

## 2019-03-02 LAB — COMPREHENSIVE METABOLIC PANEL
ALT: 34 U/L (ref 0–44)
AST: 28 U/L (ref 15–41)
Albumin: 3.5 g/dL (ref 3.5–5.0)
Alkaline Phosphatase: 43 U/L (ref 38–126)
Anion gap: 13 (ref 5–15)
BUN: 18 mg/dL (ref 8–23)
CO2: 22 mmol/L (ref 22–32)
Calcium: 8.4 mg/dL — ABNORMAL LOW (ref 8.9–10.3)
Chloride: 99 mmol/L (ref 98–111)
Creatinine, Ser: 0.9 mg/dL (ref 0.61–1.24)
GFR calc Af Amer: >60 mL/min (ref >60)
GFR calc non Af Amer: >60 mL/min (ref >60)
Glucose, Bld: 101 mg/dL — ABNORMAL HIGH (ref 70–99)
Potassium: 3.3 mmol/L — ABNORMAL LOW (ref 3.5–5.1)
Sodium: 134 mmol/L — ABNORMAL LOW (ref 135–145)
Total Bilirubin: 1 mg/dL (ref 0.3–1.2)
Total Protein: 6.3 g/dL — ABNORMAL LOW (ref 6.5–8.1)

## 2019-03-02 LAB — CBC WITH DIFFERENTIAL/PLATELET
Abs Immature Granulocytes: 0.04 10*3/uL (ref 0.00–0.07)
Basophils Absolute: 0 10*3/uL (ref 0.0–0.1)
Basophils Relative: 0 %
Eosinophils Absolute: 0 10*3/uL (ref 0.0–0.5)
Eosinophils Relative: 0 %
HCT: 39.8 % (ref 39.0–52.0)
Hemoglobin: 13.4 g/dL (ref 13.0–17.0)
Immature Granulocytes: 1 %
Lymphocytes Relative: 10 %
Lymphs Abs: 0.6 10*3/uL — ABNORMAL LOW (ref 0.7–4.0)
MCH: 28.1 pg (ref 26.0–34.0)
MCHC: 33.7 g/dL (ref 30.0–36.0)
MCV: 83.4 fL (ref 80.0–100.0)
Monocytes Absolute: 0.4 10*3/uL (ref 0.1–1.0)
Monocytes Relative: 7 %
Neutro Abs: 5.1 10*3/uL (ref 1.7–7.7)
Neutrophils Relative %: 82 %
Platelets: 188 10*3/uL (ref 150–400)
RBC: 4.77 MIL/uL (ref 4.22–5.81)
RDW: 11.9 % (ref 11.5–15.5)
WBC: 6.1 10*3/uL (ref 4.0–10.5)
nRBC: 0 % (ref 0.0–0.2)

## 2019-03-02 LAB — TROPONIN I (HIGH SENSITIVITY): Troponin I (High Sensitivity): 6 ng/L (ref <18)

## 2019-03-02 MED ORDER — AZITHROMYCIN 250 MG PO TABS: ORAL_TABLET | ORAL | 0 refills | Status: DC

## 2019-03-02 MED ORDER — POTASSIUM CHLORIDE CRYS ER 20 MEQ PO TBCR
40.0000 meq | EXTENDED_RELEASE_TABLET | Freq: Once | ORAL | Status: AC
  Administered 2019-03-02: 19:00:00 40 meq via ORAL
  Filled 2019-03-02: qty 2

## 2019-03-02 MED ORDER — PREDNISONE 10 MG (21) PO TBPK: ORAL_TABLET | ORAL | 0 refills | Status: DC

## 2019-03-02 MED ORDER — GUAIFENESIN ER 600 MG PO TB12: 600.0000 mg | ORAL_TABLET | Freq: Two times a day (BID) | ORAL | 2 refills | Status: DC

## 2019-03-02 NOTE — ED Triage Notes (Signed)
Pt tested positive for COVID on Sunday for preop testing, had no sx. Pt states that he has developed dry cough since and fever at home. His PCP prescribed him steroids, albuterol and benzonate for sx. Pt able to talk in full and complete sentences without any noted distress. States 101.55F at  Home.

## 2019-03-02 NOTE — ED Notes (Signed)
Lavender and light green tubes sent to lab. 

## 2019-03-02 NOTE — Discharge Instructions (Addendum)
Follow-up with your regular doctor as needed.  Return emergency department for her more difficulty breathing.  Use the Mucinex as directed.  This will help break up the mucous plugs in your lungs.  Zithromax as directed.  I did prescribe you another steroid pack which she should start tomorrow.  Continue to use your inhaler.  Try laying on your stomach sometimes this has helped some of the patients in the hospital.  Return as needed.  Take Tylenol for fever.

## 2019-03-02 NOTE — ED Provider Notes (Signed)
[facility] Emergency Department Provider Note  ____________________________________________   None    (approximate)  I have reviewed the triage vital signs and the nursing notes.   HISTORY  Chief Complaint Fever and Other (COVID)    HPI James Moore is a 68 y.o. male presents emergency department complaining of chest pain, shortness of breath, and being Covid positive.  Patient states he had a test done last on 02/24/2019 for preop..  They called him  to let him know he was positive.  He was asymptomatic at that time.  Now he states he has had some chest pain and shortness of breath for 3 days.  Difficulty breathing.  Does not feel like he can take a deep breath.  Fever has been increasing to 101-102.  He states he was asymptomatic at the time of diagnosis.  All of his surgeries have been canceled at this point.  He denies abdominal pain, vomiting or diarrhea.    Past Medical History:  Diagnosis Date  . Colon polyps   . Hemorrhoid    prn otc hem.cream  . History of MRSA infection   . Sleep apnea    lost weight so dc'd cpap    Patient Active Problem List   Diagnosis Date Noted  . Baker cyst, left 01/10/2018  . History of MRSA infection 06/17/2015  . Bulge of cervical disc without myelopathy 06/17/2015  . Basal cell carcinoma of face 06/17/2015    Past Surgical History:  Procedure Laterality Date  . COLONOSCOPY  2007  . KNEE SURGERY Left    2005-2006- meniscus repair  . POLYPECTOMY      Prior to Admission medications   Medication Sig Start Date End Date Taking? Authorizing Provider  albuterol (VENTOLIN HFA) 108 (90 Base) MCG/ACT inhaler Inhale 2 puffs into the lungs every 6 (six) hours as needed for wheezing or shortness of breath. 02/26/19   Mar Daring, PA-C  azithromycin (ZITHROMAX Z-PAK) 250 MG tablet 2 pills today then 1 pill a day for 4 days 03/02/19   Caryn Section Linden Dolin, PA-C  benzonatate (TESSALON) 200 MG capsule Take 1  capsule (200 mg total) by mouth 3 (three) times daily as needed. 02/26/19   Mar Daring, PA-C  guaiFENesin (MUCINEX) 600 MG 12 hr tablet Take 1 tablet (600 mg total) by mouth 2 (two) times daily. 03/02/19 03/01/20  Jumar Greenstreet, Linden Dolin, PA-C  predniSONE (STERAPRED UNI-PAK 21 TAB) 10 MG (21) TBPK tablet Take 6 pills on day one then decrease by 1 pill each day 03/02/19   Versie Starks, PA-C  terbinafine (LAMISIL) 250 MG tablet Take 250 mg by mouth daily. 10/26/18   [provider]    Allergies Patient has no known allergies.  Family History  Problem Relation Age of Onset  . Crohn's disease Mother   . Colon polyps Mother   . Dementia Father   . Colon cancer Neg Hx   . Rectal cancer Neg Hx   . Stomach cancer Neg Hx     Social History Social History   Tobacco Use  . Smoking status: Never Smoker  . Smokeless tobacco: Never Used  Substance Use Topics  . Alcohol use: Not Currently    Alcohol/week: 0.0 standard drinks  . Drug use: No    Review of Systems  Constitutional: Positive fever/chills Eyes: No visual changes. ENT: No sore throat. Respiratory: Positive cough Cardiovascular: Positive chest pain Genitourinary: Negative for dysuria. Musculoskeletal: Negative for back pain. Skin: Negative for  rash.    ____________________________________________   PHYSICAL EXAM:  VITAL SIGNS: ED Triage Vitals [03/02/19 1719]  Enc Vitals Group     BP 105/62     Pulse Rate 88     Resp 18     Temp (!) 101.5 F (38.6 C)     Temp Source Oral     SpO2 96 %     Weight 207 lb 3.7 oz (94 kg)     Height 6' (1.829 m)     Head Circumference      Peak Flow      Pain Score 0     Pain Loc      Pain Edu?      Excl. in [place]?     Constitutional: Alert and oriented. Well appearing and in no acute distress.  Patient's vitals are normal but he does appear to be having some difficulty breathing Eyes: Conjunctivae are normal.  Head: Atraumatic. Nose: No congestion/rhinnorhea.  Mouth/Throat: Mucous membranes are moist.   Neck:  supple no lymphadenopathy noted Cardiovascular: Normal rate, regular rhythm. Heart sounds are normal Respiratory: Normal respiratory effort.  No retractions, lungs c t a  GU: deferred Musculoskeletal: FROM all extremities, warm and well perfused Neurologic:  Normal speech and language.  Skin:  Skin is warm, dry and intact. No rash noted. Psychiatric: Mood and affect are normal. Speech and behavior are normal.  ____________________________________________   LABS (all labs ordered are listed, but only abnormal results are displayed)  Labs Reviewed  COMPREHENSIVE METABOLIC PANEL - Abnormal; Notable for the following components:      Result Value   Sodium 134 (*)    Potassium 3.3 (*)    Glucose, Bld 101 (*)    Calcium 8.4 (*)    Total Protein 6.3 (*)    All other components within normal limits  CBC WITH DIFFERENTIAL/PLATELET - Abnormal; Notable for the following components:   Lymphs Abs 0.6 (*)    All other components within normal limits  TROPONIN I (HIGH SENSITIVITY)   ____________________________________________   ____________________________________________  RADIOLOGY  Chest x-ray does not show a focal pneumonia  ____________________________________________   PROCEDURES  Procedure(s) performed: EKG is normal  Procedures    ____________________________________________   INITIAL IMPRESSION / ASSESSMENT AND PLAN / ED COURSE  Pertinent labs & imaging results that were available during my care of the patient were reviewed by me and considered in my medical decision making (see chart for details).   Patient is 68 year old male presents emergency department for worsening Covid symptoms.  Physical exam shows patient to have normal vitals but does appear to be having difficulty breathing.  Lungs are still clear to auscultation.  CBC, Met C, troponin, chest x-ray and EKG ordered   CBC is normal, comprehensive  metabolic panel shows a decrease in his potassium.  Levels at 3.3, troponins normal  Due to the decreased potassium patient was given K. Dur 11 M EQ's here in the ED. In the discussion with the patient, I gave him another prescription for Sterapred, prescription for Z-Pak, and Mucinex.  He has not taking Mucinex which would help decrease some of the mucous plugs.  He was given strict instructions to return if worsening.  He appears to be stable at this time he will find that he is going home.  However told him if he is worsening he must return emergency department for evaluation.  He states he understands will comply.  He was discharged in stable condition.  Alyson Locket  Franchino was evaluated in Emergency Department on 03/02/2019 for the symptoms described in the history of present illness. He was evaluated in the context of the global COVID-19 pandemic, which necessitated consideration that the patient might be at risk for infection with the SARS-CoV-2 virus that causes COVID-19. Institutional protocols and algorithms that pertain to the evaluation of patients at risk for COVID-19 are in a state of rapid change based on information released by regulatory bodies including the CDC and federal and state organizations. These policies and algorithms were followed during the patient's care in the ED.   As part of my medical decision making, I reviewed the following data within the [facility] notes reviewed and incorporated, Labs reviewed see above, EKG interpreted NSR, Old chart reviewed, Radiograph reviewed chest x-ray does not show pneumonia, Notes from prior ED visits and [place] Controlled Substance Database  ____________________________________________   FINAL CLINICAL IMPRESSION(S) / ED DIAGNOSES  Final diagnoses:  COVID-19      NEW MEDICATIONS STARTED DURING THIS VISIT:  New Prescriptions   AZITHROMYCIN (ZITHROMAX Z-PAK) 250 MG TABLET    2 pills today then 1 pill a day for 4  days   GUAIFENESIN (MUCINEX) 600 MG 12 HR TABLET    Take 1 tablet (600 mg total) by mouth 2 (two) times daily.   PREDNISONE (STERAPRED UNI-PAK 21 TAB) 10 MG (21) TBPK TABLET    Take 6 pills on day one then decrease by 1 pill each day     Note:  This document was prepared using Dragon voice recognition software and may include unintentional dictation errors.    Versie Starks, PA-C 03/02/19 1911    Harvest Dark, MD 03/02/19 2025

## 2019-03-02 NOTE — ED Triage Notes (Signed)
Tested positive covid Sunday. Arrived by EMS for SOB, chills, and fever. "Took tylenol around noon and still has fever" Vitals  86HR, 170/32, 100.8oral with EMS. EMS administered 600mg  Ibuprofen

## 2019-03-05 ENCOUNTER — Telehealth: Payer: Self-pay | Admitting: Family Medicine

## 2019-03-05 NOTE — Telephone Encounter (Signed)
From [facility] 

## 2019-03-05 NOTE — Telephone Encounter (Signed)
Pt called in requesting his blood type. Pt says that he was told that he need to find out?   Pt says that he has tested positive for covid   CB: [phone]

## 2019-03-06 NOTE — Telephone Encounter (Signed)
I don't know. The best way to find out is to donate blood

## 2019-03-06 NOTE — Telephone Encounter (Signed)
Patient advised.

## 2019-03-07 ENCOUNTER — Encounter: Payer: Self-pay | Admitting: Surgery

## 2019-03-09 ENCOUNTER — Other Ambulatory Visit: Payer: Self-pay

## 2019-03-09 DIAGNOSIS — Z20822 Contact with and (suspected) exposure to covid-19: Secondary | ICD-10-CM

## 2019-03-11 ENCOUNTER — Encounter: Payer: Self-pay | Admitting: Physician Assistant

## 2019-03-11 ENCOUNTER — Other Ambulatory Visit: Payer: Self-pay

## 2019-03-11 ENCOUNTER — Encounter
Admission: RE | Admit: 2019-03-11 | Discharge: 2019-03-11 | Disposition: A | Discharge status: Home / Self Care | Payer: Managed Care, Other (non HMO) | Source: Ambulatory Visit | Attending: Surgery | Admitting: Surgery

## 2019-03-11 DIAGNOSIS — R058 Other specified cough: Secondary | ICD-10-CM

## 2019-03-11 DIAGNOSIS — R05 Cough: Secondary | ICD-10-CM

## 2019-03-11 LAB — NOVEL CORONAVIRUS, NAA: SARS-CoV-2, NAA: DETECTED — AB

## 2019-03-11 MED ORDER — PREDNISONE 20 MG PO TABS: 20.0000 mg | ORAL_TABLET | Freq: Two times a day (BID) | ORAL | 0 refills | Status: DC

## 2019-03-11 NOTE — Telephone Encounter (Signed)
I advised pt that "Detected" means a positive result.  He will have to quarantine and stay isolated from members in his house.  He stated he has a surgery coming up 12/03, I advised him he would have to call them and reschedule.    Thanks,   -Mickel Baas

## 2019-03-11 NOTE — Patient Instructions (Signed)
Your procedure is scheduled on: 03/19/19 Report to [place]. To find out your arrival time please call [phone] between 1PM - 3PM on 03/18/19.  Remember: Instructions that are not followed completely may result in serious medical risk, up to and including death, or upon the discretion of your surgeon and anesthesiologist your surgery may need to be rescheduled.     _X__ 1. Do not eat food after midnight the night before your procedure.                 No gum chewing or hard candies. You may drink clear liquids up to 2 hours                 before you are scheduled to arrive for your surgery- DO not drink clear                 liquids within 2 hours of the start of your surgery.                 Clear Liquids include:  water, apple juice without pulp, clear carbohydrate                 drink such as Clearfast or Gatorade, Black Coffee or Tea (Do not add                 anything to coffee or tea). Diabetics water only  __X__2.  On the morning of surgery brush your teeth with toothpaste and water, you                 may rinse your mouth with mouthwash if you wish.  Do not swallow any              toothpaste of mouthwash.     _X__ 3.  No Alcohol for 24 hours before or after surgery.   _X__ 4.  Do Not Smoke or use e-cigarettes For 24 Hours Prior to Your Surgery.                 Do not use any chewable tobacco products for at least 6 hours prior to                 surgery.  ____  5.  Bring all medications with you on the day of surgery if instructed.   __X__  6.  Notify your doctor if there is any change in your medical condition      (cold, fever, infections).     Do not wear jewelry, make-up, hairpins, clips or nail polish. Do not wear lotions, powders, or perfumes.  Do not shave 48 hours prior to surgery. Men may shave face and neck. Do not bring valuables to the hospital.    [facility] is not responsible for any belongings or  valuables.  Contacts, dentures/partials or body piercings may not be worn into surgery. Bring a case for your contacts, glasses or hearing aids, a denture cup will be supplied. Leave your suitcase in the car. After surgery it may be brought to your room. For patients admitted to the hospital, discharge time is determined by your treatment team.   Patients discharged the day of surgery will not be allowed to drive home.   Please read over the following fact sheets that you were given:   MRSA Information  __X__ Take these medicines the morning of surgery with A SIP OF WATER:  1. none  2.   3.   4.  5.  6.  ____ Fleet Enema (as directed)   ____ Use CHG Soap/SAGE wipes as directed  __X__ Use inhalers on the day of surgery  ____ Stop metformin/Janumet/Farxiga 2 days prior to surgery    ____ Take 1/2 of usual insulin dose the night before surgery. No insulin the morning          of surgery.   ____ Stop Blood Thinners Coumadin/Plavix/Xarelto/Pleta/Pradaxa/Eliquis/Effient/Aspirin  on   Or contact your Surgeon, Cardiologist or Medical Doctor regarding  ability to stop your blood thinners  __X__ Stop Anti-inflammatories 7 days before surgery such as Advil, Ibuprofen, Motrin,  BC or Goodies Powder, Naprosyn, Naproxen, Aleve, Aspirin    __X__ Stop all herbal supplements, fish oil or vitamin E until after surgery.    ____ Bring C-Pap to the hospital.    PHONE INTERVIEW

## 2019-03-16 ENCOUNTER — Other Ambulatory Visit: Payer: Managed Care, Other (non HMO)

## 2019-03-17 HISTORY — PX: CATARACT EXTRACTION: SUR2

## 2019-03-19 ENCOUNTER — Ambulatory Visit: Payer: Managed Care, Other (non HMO) | Admitting: Certified Registered Nurse Anesthetist

## 2019-03-19 ENCOUNTER — Encounter
Admission: RE | Disposition: A | Discharge status: Home / Self Care | Payer: Self-pay | Source: Home / Self Care | Attending: Surgery

## 2019-03-19 ENCOUNTER — Other Ambulatory Visit: Payer: Self-pay

## 2019-03-19 ENCOUNTER — Ambulatory Visit
Admission: RE | Admit: 2019-03-19 | Discharge: 2019-03-19 | Disposition: A | Discharge status: Home / Self Care | Payer: Managed Care, Other (non HMO) | Attending: Surgery | Admitting: Surgery

## 2019-03-19 DIAGNOSIS — Z79899 Other long term (current) drug therapy: Secondary | ICD-10-CM | POA: Insufficient documentation

## 2019-03-19 DIAGNOSIS — G4730 Sleep apnea, unspecified: Secondary | ICD-10-CM | POA: Diagnosis not present

## 2019-03-19 DIAGNOSIS — K429 Umbilical hernia without obstruction or gangrene: Secondary | ICD-10-CM | POA: Diagnosis present

## 2019-03-19 DIAGNOSIS — K420 Umbilical hernia with obstruction, without gangrene: Secondary | ICD-10-CM | POA: Diagnosis not present

## 2019-03-19 HISTORY — PX: UMBILICAL HERNIA REPAIR: SHX196

## 2019-03-19 SURGERY — REPAIR, HERNIA, UMBILICAL, ADULT
Anesthesia: General

## 2019-03-19 MED ORDER — IBUPROFEN 600 MG PO TABS: 600.0000 mg | ORAL_TABLET | Freq: Three times a day (TID) | ORAL | 0 refills | Status: DC | PRN

## 2019-03-19 MED ORDER — SUGAMMADEX SODIUM 200 MG/2ML IV SOLN
INTRAVENOUS | Status: DC | PRN
  Administered 2019-03-19: 200 mg via INTRAVENOUS

## 2019-03-19 MED ORDER — LIDOCAINE HCL (CARDIAC) PF 100 MG/5ML IV SOSY
PREFILLED_SYRINGE | INTRAVENOUS | Status: DC | PRN
  Administered 2019-03-19: 100 mg via INTRAVENOUS

## 2019-03-19 MED ORDER — MIDAZOLAM HCL 2 MG/2ML IJ SOLN
INTRAMUSCULAR | Status: AC
  Filled 2019-03-19: qty 2

## 2019-03-19 MED ORDER — BUPIVACAINE HCL (PF) 0.5 % IJ SOLN
INTRAMUSCULAR | Status: AC
  Filled 2019-03-19: qty 30

## 2019-03-19 MED ORDER — KETOROLAC TROMETHAMINE 30 MG/ML IJ SOLN: 30.0000 mg | Freq: Once | INTRAMUSCULAR | Status: DC | PRN

## 2019-03-19 MED ORDER — FENTANYL CITRATE (PF) 100 MCG/2ML IJ SOLN
INTRAMUSCULAR | Status: AC
  Filled 2019-03-19: qty 2

## 2019-03-19 MED ORDER — LACTATED RINGERS IV SOLN
INTRAVENOUS | Status: DC
  Administered 2019-03-19: 09:00:00 via INTRAVENOUS

## 2019-03-19 MED ORDER — ACETAMINOPHEN 160 MG/5ML PO SOLN
325.0000 mg | ORAL | Status: DC | PRN
  Filled 2019-03-19: qty 20.3

## 2019-03-19 MED ORDER — ROCURONIUM BROMIDE 100 MG/10ML IV SOLN
INTRAVENOUS | Status: DC | PRN
  Administered 2019-03-19: 40 mg via INTRAVENOUS

## 2019-03-19 MED ORDER — HYDROCODONE-ACETAMINOPHEN 7.5-325 MG PO TABS
1.0000 | ORAL_TABLET | Freq: Once | ORAL | Status: DC | PRN
  Filled 2019-03-19: qty 1

## 2019-03-19 MED ORDER — BUPIVACAINE LIPOSOME 1.3 % IJ SUSP
INTRAMUSCULAR | Status: AC
  Filled 2019-03-19: qty 20

## 2019-03-19 MED ORDER — CEFAZOLIN SODIUM-DEXTROSE 2-4 GM/100ML-% IV SOLN: 2.0000 g | INTRAVENOUS | Status: DC

## 2019-03-19 MED ORDER — CHLORHEXIDINE GLUCONATE CLOTH 2 % EX PADS: 6.0000 | MEDICATED_PAD | Freq: Once | CUTANEOUS | Status: DC

## 2019-03-19 MED ORDER — FENTANYL CITRATE (PF) 100 MCG/2ML IJ SOLN: 25.0000 ug | INTRAMUSCULAR | Status: DC | PRN

## 2019-03-19 MED ORDER — ACETAMINOPHEN 500 MG PO TABS
1000.0000 mg | ORAL_TABLET | ORAL | Status: AC
  Administered 2019-03-19: 09:00:00 1000 mg via ORAL

## 2019-03-19 MED ORDER — BUPIVACAINE LIPOSOME 1.3 % IJ SUSP: 20.0000 mL | Freq: Once | INTRAMUSCULAR | Status: DC

## 2019-03-19 MED ORDER — LACTATED RINGERS IV SOLN
INTRAVENOUS | Status: DC | PRN
  Administered 2019-03-19: 10:00:00 via INTRAVENOUS

## 2019-03-19 MED ORDER — CHLORHEXIDINE GLUCONATE CLOTH 2 % EX PADS
6.0000 | MEDICATED_PAD | Freq: Once | CUTANEOUS | Status: AC
  Administered 2019-03-19: 6 via TOPICAL

## 2019-03-19 MED ORDER — ROCURONIUM BROMIDE 50 MG/5ML IV SOLN
INTRAVENOUS | Status: AC
  Filled 2019-03-19: qty 1

## 2019-03-19 MED ORDER — ACETAMINOPHEN 325 MG PO TABS: 325.0000 mg | ORAL_TABLET | ORAL | Status: DC | PRN

## 2019-03-19 MED ORDER — EPINEPHRINE PF 1 MG/ML IJ SOLN
INTRAMUSCULAR | Status: AC
  Filled 2019-03-19: qty 1

## 2019-03-19 MED ORDER — CEFAZOLIN SODIUM-DEXTROSE 2-4 GM/100ML-% IV SOLN
INTRAVENOUS | Status: AC
  Filled 2019-03-19: qty 100

## 2019-03-19 MED ORDER — ACETAMINOPHEN 500 MG PO TABS
ORAL_TABLET | ORAL | Status: AC
  Administered 2019-03-19: 1000 mg via ORAL
  Filled 2019-03-19: qty 2

## 2019-03-19 MED ORDER — SUCCINYLCHOLINE CHLORIDE 20 MG/ML IJ SOLN
INTRAMUSCULAR | Status: AC
  Filled 2019-03-19: qty 1

## 2019-03-19 MED ORDER — PROMETHAZINE HCL 25 MG/ML IJ SOLN: 6.2500 mg | INTRAMUSCULAR | Status: DC | PRN

## 2019-03-19 MED ORDER — KETOROLAC TROMETHAMINE 30 MG/ML IJ SOLN
INTRAMUSCULAR | Status: DC | PRN
  Administered 2019-03-19: 30 mg via INTRAVENOUS

## 2019-03-19 MED ORDER — DEXAMETHASONE SODIUM PHOSPHATE 10 MG/ML IJ SOLN
INTRAMUSCULAR | Status: DC | PRN
  Administered 2019-03-19: 10 mg via INTRAVENOUS

## 2019-03-19 MED ORDER — BUPIVACAINE-EPINEPHRINE (PF) 0.5% -1:200000 IJ SOLN
INTRAMUSCULAR | Status: DC | PRN
  Administered 2019-03-19: 30 mL

## 2019-03-19 MED ORDER — PROPOFOL 10 MG/ML IV BOLUS
INTRAVENOUS | Status: DC | PRN
  Administered 2019-03-19: 200 mg via INTRAVENOUS

## 2019-03-19 MED ORDER — ONDANSETRON HCL 4 MG/2ML IJ SOLN
INTRAMUSCULAR | Status: DC | PRN
  Administered 2019-03-19: 4 mg via INTRAVENOUS

## 2019-03-19 MED ORDER — LIDOCAINE HCL (PF) 2 % IJ SOLN
INTRAMUSCULAR | Status: AC
  Filled 2019-03-19: qty 10

## 2019-03-19 MED ORDER — GABAPENTIN 300 MG PO CAPS
300.0000 mg | ORAL_CAPSULE | ORAL | Status: AC
  Administered 2019-03-19: 09:00:00 300 mg via ORAL

## 2019-03-19 MED ORDER — FENTANYL CITRATE (PF) 100 MCG/2ML IJ SOLN
INTRAMUSCULAR | Status: DC | PRN
  Administered 2019-03-19: 100 ug via INTRAVENOUS

## 2019-03-19 MED ORDER — FAMOTIDINE 20 MG PO TABS
ORAL_TABLET | ORAL | Status: AC
  Administered 2019-03-19: 20 mg via ORAL
  Filled 2019-03-19: qty 1

## 2019-03-19 MED ORDER — PROPOFOL 10 MG/ML IV BOLUS
INTRAVENOUS | Status: AC
  Filled 2019-03-19: qty 20

## 2019-03-19 MED ORDER — MIDAZOLAM HCL 2 MG/2ML IJ SOLN
INTRAMUSCULAR | Status: DC | PRN
  Administered 2019-03-19: 2 mg via INTRAVENOUS

## 2019-03-19 MED ORDER — MEPERIDINE HCL 50 MG/ML IJ SOLN: 6.2500 mg | INTRAMUSCULAR | Status: DC | PRN

## 2019-03-19 MED ORDER — GABAPENTIN 300 MG PO CAPS
ORAL_CAPSULE | ORAL | Status: AC
  Administered 2019-03-19: 300 mg via ORAL
  Filled 2019-03-19: qty 1

## 2019-03-19 MED ORDER — FAMOTIDINE 20 MG PO TABS
20.0000 mg | ORAL_TABLET | Freq: Once | ORAL | Status: AC
  Administered 2019-03-19: 09:00:00 20 mg via ORAL

## 2019-03-19 MED ORDER — OXYCODONE HCL 5 MG PO TABS: 5.0000 mg | ORAL_TABLET | ORAL | 0 refills | Status: DC | PRN

## 2019-03-19 SURGICAL SUPPLY — 30 items
ADH SKN CLS APL DERMABOND .7 (GAUZE/BANDAGES/DRESSINGS) ×1
APL PRP STRL LF DISP 70% ISPRP (MISCELLANEOUS) ×1
BLADE SURG 15 STRL LF DISP TIS (BLADE) ×1 IMPLANT
BLADE SURG 15 STRL SS (BLADE) ×3
CANISTER SUCT 1200ML W/VALVE (MISCELLANEOUS) ×3 IMPLANT
CHLORAPREP W/TINT 26 (MISCELLANEOUS) ×3 IMPLANT
COVER WAND RF STERILE (DRAPES) ×3 IMPLANT
DERMABOND ADVANCED (GAUZE/BANDAGES/DRESSINGS) ×2
DERMABOND ADVANCED .7 DNX12 (GAUZE/BANDAGES/DRESSINGS) ×1 IMPLANT
DRAPE LAPAROTOMY 77X122 PED (DRAPES) ×3 IMPLANT
ELECT REM PT RETURN 9FT ADLT (ELECTROSURGICAL) ×3
ELECTRODE REM PT RTRN 9FT ADLT (ELECTROSURGICAL) ×1 IMPLANT
GLOVE SURG SYN 7.0 (GLOVE) ×3 IMPLANT
GLOVE SURG SYN 7.0 PF PI (GLOVE) ×1 IMPLANT
GLOVE SURG SYN 7.5  E (GLOVE) ×2
GLOVE SURG SYN 7.5 E (GLOVE) ×1 IMPLANT
GLOVE SURG SYN 7.5 PF PI (GLOVE) ×1 IMPLANT
GOWN STRL REUS W/ TWL LRG LVL3 (GOWN DISPOSABLE) ×3 IMPLANT
GOWN STRL REUS W/TWL LRG LVL3 (GOWN DISPOSABLE) ×9
NEEDLE HYPO 22GX1.5 SAFETY (NEEDLE) ×3 IMPLANT
NS IRRIG 500ML POUR BTL (IV SOLUTION) ×3 IMPLANT
PACK BASIN MINOR ARMC (MISCELLANEOUS) ×3 IMPLANT
SUT ETHIBOND 0 MO6 C/R (SUTURE) ×3 IMPLANT
SUT MNCRL AB 4-0 PS2 18 (SUTURE) ×3 IMPLANT
SUT VIC AB 2-0 SH 27 (SUTURE) ×3
SUT VIC AB 2-0 SH 27XBRD (SUTURE) ×1 IMPLANT
SUT VIC AB 3-0 SH 27 (SUTURE) ×3
SUT VIC AB 3-0 SH 27X BRD (SUTURE) ×1 IMPLANT
SYR 20ML LL LF (SYRINGE) ×3 IMPLANT
SYR BULB IRRIG 60ML STRL (SYRINGE) ×3 IMPLANT

## 2019-03-19 NOTE — Interval H&P Note (Signed)
History and Physical Interval Note:  03/19/2019 8:45 AM  James Moore  has presented today for surgery, with the diagnosis of umbilical hernia.  The various methods of treatment have been discussed with the patient and family. After consideration of risks, benefits and other options for treatment, the patient has consented to  Procedure(s): HERNIA REPAIR UMBILICAL ADULT (N/A) as a surgical intervention.  The patient's history has been reviewed, patient examined, no change in status, stable for surgery.  I have reviewed the patient's chart and labs.  Questions were answered to the patient's satisfaction.  Of note, patient did test positive for COVID on 11/10, presented to the ED for worsening symptoms on 11/16.  Now he's symptom free and has been more than 21 days since the initial positive test result.  We can proceed with surgery.   James Moore

## 2019-03-19 NOTE — Op Note (Signed)
Procedure Date:  03/19/2019  Pre-operative Diagnosis:  Umbilical hernia  Post-operative Diagnosis:  Incarcerated umbilical hernia  Procedure:  Umbilical hernia repair  Surgeon:  Melvyn Neth, MD  Anesthesia:  General endotracheal  Estimated Blood Loss:  3 ml  Specimens:  Hernia sac  Complications:  None  Indications for Procedure:  This is a 68 y.o. male who presents with an initially reducible umbilical hernia.  The risks of bleeding, abscess or infection, injury to surrounding structures, and need for further procedures were all discussed with the patient and was willing to proceed.  Description of Procedure: The patient was correctly identified in the preoperative area and brought into the operating room.  The patient was placed supine with VTE prophylaxis in place.  Appropriate time-outs were performed.  Anesthesia was induced and the patient was intubated.  Appropriate antibiotics were infused.  I was unable to reduce the hernia despite of being under full anesthesia.  The abdomen was prepped and draped in a sterile fashion.  An infraumbilical curvilinear incision was made and cautery was used to dissect down the subcutaneous tissue along the umbilical stalk.  Kelly forceps were used to dissect the umbilical stalk and it was divided at the fascia using cautery.  This allowed visualization of the hernia contents.  The patient's hernia sac was entered revealing omentum.  This was healthy and there was no evidence of strangulation.  Once the hernia sac was opened, the hernia contents were reduced without complications.  The hernia sac was excised.  The fascial edges were cleaned using cautery.  Overall, the hernia defect measured about 1.5 cm.  The fascia was then closed primarily with 0 Ethibond sutures.  Local anesthetic was injected onto the fascia and subcutaneous tissue.  The umbilical stalk was then reattached to the fascia using 2-0 Vicryl sutures. The wound was irrigated and  local anesthetic was infused.  The wound was then closed in layers using 3-0 Vicryl and 4-0 Monocryl. The incision was cleaned and sealed with DermaBond.  The patient was emerged from anesthesia and extubated and brought to the recovery room for further management.  The patient tolerated the procedure well and all counts were correct at the end of the case.   Melvyn Neth, MD

## 2019-03-19 NOTE — Anesthesia Post-op Follow-up Note (Signed)
Anesthesia QCDR form completed.        

## 2019-03-19 NOTE — Transfer of Care (Signed)
Immediate Anesthesia Transfer of Care Note  Patient: James Moore  Procedure(s) Performed: HERNIA REPAIR UMBILICAL ADULT (N/A )  Patient Location: PACU  Anesthesia Type:General  Level of Consciousness: sedated  Airway & Oxygen Therapy: Patient Spontanous Breathing and Patient connected to face mask oxygen  Post-op Assessment: Report given to RN and Post -op Vital signs reviewed and stable  Post vital signs: Reviewed and stable  Last Vitals:  Vitals Value Taken Time  BP 132/85 03/19/19 1058  Temp    Pulse 63 03/19/19 1101  Resp 15 03/19/19 1101  SpO2 100 % 03/19/19 1101  Vitals shown include unvalidated device data.  Last Pain:  Vitals:   03/19/19 0816  TempSrc: Temporal  PainSc: 0-No pain         Complications: No apparent anesthesia complications

## 2019-03-19 NOTE — Anesthesia Preprocedure Evaluation (Signed)
Anesthesia Evaluation  Patient identified by MRN, date of birth, ID band Patient awake    Reviewed: Allergy & Precautions, H&P , NPO status , reviewed documented beta blocker date and time   Airway Mallampati: II  TM Distance: >3 FB Neck ROM: full    Dental  (+) Caps   Pulmonary sleep apnea ,  Does not require CPAP anymore   Pulmonary exam normal        Cardiovascular Normal cardiovascular exam     Neuro/Psych    GI/Hepatic neg GERD  ,  Endo/Other    Renal/GU      Musculoskeletal   Abdominal   Peds  Hematology   Anesthesia Other Findings Past Medical History: No date: Colon polyps No date: Hemorrhoid     Comment:  prn otc hem.cream No date: History of MRSA infection No date: Sleep apnea     Comment:  lost weight so dc'd cpap Past Surgical History: 2007: COLONOSCOPY No date: KNEE SURGERY; Left     Comment:  2005-2006- meniscus repair No date: POLYPECTOMY BMI    Body Mass Index: 25.73 kg/m     Reproductive/Obstetrics                             Anesthesia Physical Anesthesia Plan  ASA: II  Anesthesia Plan: General   Post-op Pain Management:    Induction: Intravenous  PONV Risk Score and Plan: Ondansetron and Treatment may vary due to age or medical condition  Airway Management Planned: Oral ETT  Additional Equipment:   Intra-op Plan:   Post-operative Plan: Extubation in OR  Informed Consent: I have reviewed the patients History and Physical, chart, labs and discussed the procedure including the risks, benefits and alternatives for the proposed anesthesia with the patient or authorized representative who has indicated his/her understanding and acceptance.     Dental Advisory Given  Plan Discussed with: CRNA  Anesthesia Plan Comments:         Anesthesia Quick Evaluation

## 2019-03-19 NOTE — Discharge Instructions (Signed)
Hernia, Adult ° °  ° °A hernia happens when tissue inside your body pushes out through a weak spot in your belly muscles (abdominal wall). This makes a round lump (bulge). The lump may be: °· In a scar from surgery that was done in your belly (incisional hernia). °· Near your belly button (umbilical hernia). °· In your groin (inguinal hernia). Your groin is the area where your leg meets your lower belly (abdomen). This kind of hernia could also be: °? In your scrotum, if you are male. °? In folds of skin around your vagina, if you are male. °· In your upper thigh (femoral hernia). °· Inside your belly (hiatal hernia). This happens when your stomach slides above the muscle between your belly and your chest (diaphragm). °If your hernia is small and it does not cause pain, you may not need treatment. If your hernia is large or it causes pain, you may need surgery. °Follow these instructions at home: °Activity °· Avoid stretching or overusing (straining) the muscles near your hernia. Straining can happen when you: °? Lift something heavy. °? Poop (have a bowel movement). °· Do not lift anything that is heavier than 10 lb (4.5 kg), or the limit that you are told, until your doctor says that it is safe. °· Use the strength of your legs when you lift something heavy. Do not use only your back muscles to lift. °General instructions °· Do these things if told by your doctor so you do not have trouble pooping (constipation): °? Drink enough fluid to keep your pee (urine) pale yellow. °? Eat foods that are high in fiber. These include fresh fruits and vegetables, whole grains, and beans. °? Limit foods that are high in fat and processed sugars. These include foods that are fried or sweet. °? Take medicine for trouble pooping. °· When you cough, try to cough gently. °· You may try to push your hernia in by very gently pressing on it when you are lying down. Do not try to force the bulge back in if it will not push in  easily. °· If you are overweight, work with your doctor to lose weight safely. °· Do not use any products that have nicotine or tobacco in them. These include cigarettes and e-cigarettes. If you need help quitting, ask your doctor. °· If you will be having surgery (hernia repair), watch your hernia for changes in shape, size, or color. Tell your doctor if you see any changes. °· Take over-the-counter and prescription medicines only as told by your doctor. °· Keep all follow-up visits as told by your doctor. °Contact a doctor if: °· You get new pain, swelling, or redness near your hernia. °· You poop fewer times in a week than normal. °· You have trouble pooping. °· You have poop (stool) that is more dry than normal. °· You have poop that is harder or larger than normal. °Get help right away if: °· You have a fever. °· You have belly pain that gets worse. °· You feel sick to your stomach (nauseous). °· You throw up (vomit). °· Your hernia cannot be pushed in by very gently pressing on it when you are lying down. Do not try to force the bulge back in if it will not push in easily. °· Your hernia: °? Changes in shape or size. °? Changes color. °? Feels hard or it hurts when you touch it. °These symptoms may represent a serious problem that is an emergency. Do not   wait to see if the symptoms will go away. Get medical help right away. Call your local emergency services (911 in the [place] Summary  A hernia happens when tissue inside your body pushes out through a weak spot in the belly muscles. This creates a bulge.  If your hernia is small and it does not hurt, you may not need treatment. If your hernia is large or it hurts, you may need surgery.  If you will be having surgery, watch your hernia for changes in shape, size, or color. Tell your doctor about any changes. This information is not intended to replace advice given to you by your health care provider. Make sure you discuss any questions you have with  your health care provider.  AMBULATORY SURGERY  DISCHARGE INSTRUCTIONS   1) The drugs that you were given will stay in your system until tomorrow so for the next 24 hours you should not:  A) Drive an automobile B) Make any legal decisions C) Drink any alcoholic beverage   2) You may resume regular meals tomorrow.  Today it is better to start with liquids and gradually work up to solid foods.  You may eat anything you prefer, but it is better to start with liquids, then soup and crackers, and gradually work up to solid foods.   3) Please notify your doctor immediately if you have any unusual bleeding, trouble breathing, redness and pain at the surgery site, drainage, fever, or pain not relieved by medication.    4) Additional Instructions:        Please contact your physician with any problems or Same Day Surgery at [phone], Monday through Friday 6 am to 4 pm, or [place] [facility] number at [phone]. Document Released: 09/20/2009 Document Revised: 07/24/2018 Document Reviewed: 01/02/2017 Elsevier Patient Education  2020 [employer].

## 2019-03-19 NOTE — Anesthesia Procedure Notes (Signed)
Procedure Name: Intubation Date/Time: 03/19/2019 9:49 AM Performed by: Justus Memory, CRNA Pre-anesthesia Checklist: Patient identified, Patient being monitored, Timeout performed, Emergency Drugs available and Suction available Patient Re-evaluated:Patient Re-evaluated prior to induction Oxygen Delivery Method: Circle system utilized Preoxygenation: Pre-oxygenation with 100% oxygen Induction Type: IV induction Ventilation: Mask ventilation without difficulty Laryngoscope Size: 3 and McGraph Grade View: Grade I Tube type: Oral Tube size: 7.0 mm Number of attempts: 1 Airway Equipment and Method: Stylet Placement Confirmation: ETT inserted through vocal cords under direct vision,  positive ETCO2 and breath sounds checked- equal and bilateral Secured at: 21 cm Tube secured with: Tape Dental Injury: Teeth and Oropharynx as per pre-operative assessment

## 2019-03-19 NOTE — Anesthesia Postprocedure Evaluation (Signed)
Anesthesia Post Note  Patient: James Moore  Procedure(s) Performed: HERNIA REPAIR UMBILICAL ADULT (N/A )  Patient location during evaluation: PACU Anesthesia Type: General Level of consciousness: awake and alert Pain management: pain level controlled Vital Signs Assessment: post-procedure vital signs reviewed and stable Respiratory status: spontaneous breathing, nonlabored ventilation and respiratory function stable Cardiovascular status: blood pressure returned to baseline and stable Postop Assessment: no apparent nausea or vomiting Anesthetic complications: no     Last Vitals:  Vitals:   03/19/19 1145 03/19/19 1231  BP: 130/84 130/83  Pulse: 65 63  Resp: 16 16  Temp: (!) 36.3 C 36.4 C  SpO2: 99% 100%    Last Pain:  Vitals:   03/19/19 1231  TempSrc: Temporal  PainSc: 1                  Rilan Eiland Harvie Heck

## 2019-03-20 ENCOUNTER — Encounter: Payer: Self-pay | Admitting: Surgery

## 2019-03-20 LAB — SURGICAL PATHOLOGY

## 2019-03-31 ENCOUNTER — Telehealth (INDEPENDENT_AMBULATORY_CARE_PROVIDER_SITE_OTHER): Payer: Managed Care, Other (non HMO) | Admitting: Physician Assistant

## 2019-03-31 ENCOUNTER — Other Ambulatory Visit: Payer: Self-pay

## 2019-03-31 DIAGNOSIS — K429 Umbilical hernia without obstruction or gangrene: Secondary | ICD-10-CM

## 2019-03-31 DIAGNOSIS — Z09 Encounter for follow-up examination after completed treatment for conditions other than malignant neoplasm: Secondary | ICD-10-CM

## 2019-03-31 NOTE — Progress Notes (Signed)
[facility] SUR[facility]ICAL OFFICE VISIT - TELEPHONE (POST-OP)  Referring provider:  Birdie Sons, MD [address] [place],  [place] [address]  Virtual Visit via Telemedicine Note I connected with Vevelyn Royals by telephone at his home on 03/31/19 at 11:30 AM EST and verified that I was speaking with the correct person using their name and two idenfiers/date of birth.   I discussed the limitations, risks, security and privacy concerns of performing an evaluation and management service by telephonic telemedicine and the availability of in person appointments. I also discussed with the patient that there may be a patient responsible charge related to this service. The patient expressed understanding and agreed to proceed.   History of Present Illness: PARRY LEGATE is a 68 y.o. who is 12 days s/p umbilical hernia repair with Dr Hampton Abbot.   He reports that he "has done quite well" since the surgery. No issues with abdominal pain, nausea, emesis, or bowel changes. He is only taking ibuprofen for pain as needed. He has returned to work but is following the lifting restrictions. He denied any issue with the incision site. No other complaints.    Physical Examination:  Constitutional: Well-sounding male, NAD, no respiratory distress, no concerning findings established over the phone  Assessment/Plan:  @NAME  is a 68 y.o. 2 days s/p umbilical hernia repair    - Pain control prn  - discussed wound care  - reviewed lifting restrictions; light activity encouraged; no lifting more than 15-20 for 4 weeks post-op  - reviewed pathology; hernia sac  - discussed return precautions & signs/symptoms to watch out for  - rtc prn; advised to call with questions/concerns  From [facility] outpatient surgery office, I provided 10 minutes of non-face-to-face time during this encounter.  -- Edison Simon, PA-C [facility] 03/31/2019, 10:54 AM [phone] M-F: 7am -  4pm

## 2019-04-01 ENCOUNTER — Encounter: Payer: Self-pay | Admitting: Physician Assistant

## 2019-04-01 ENCOUNTER — Other Ambulatory Visit: Payer: Self-pay

## 2019-04-01 ENCOUNTER — Ambulatory Visit (INDEPENDENT_AMBULATORY_CARE_PROVIDER_SITE_OTHER): Payer: Managed Care, Other (non HMO) | Admitting: Physician Assistant

## 2019-04-01 VITALS — BP 141/90 | HR 67 | Temp 96.6°F | Wt 200.0 lb

## 2019-04-01 DIAGNOSIS — Z0000 Encounter for general adult medical examination without abnormal findings: Secondary | ICD-10-CM

## 2019-04-01 DIAGNOSIS — Z23 Encounter for immunization: Secondary | ICD-10-CM

## 2019-04-01 DIAGNOSIS — R350 Frequency of micturition: Secondary | ICD-10-CM

## 2019-04-01 NOTE — Patient Instructions (Addendum)
Pneumonia: Prevnar 13 Influenza Shingles Vaccine (shingrix)   Health Maintenance After Age 66 After age 98, you are at a higher risk for certain long-term diseases and infections as well as injuries from falls. Falls are a major cause of broken bones and head injuries in people who are older than age 64. Getting regular preventive care can help to keep you healthy and well. Preventive care includes getting regular testing and making lifestyle changes as recommended by your health care provider. Talk with your health care provider about:  Which screenings and tests you should have. A screening is a test that checks for a disease when you have no symptoms.  A diet and exercise plan that is right for you. What should I know about screenings and tests to prevent falls? Screening and testing are the best ways to find a health problem early. Early diagnosis and treatment give you the best chance of managing medical conditions that are common after age 52. Certain conditions and lifestyle choices may make you more likely to have a fall. Your health care provider may recommend:  Regular vision checks. Poor vision and conditions such as cataracts can make you more likely to have a fall. If you wear glasses, make sure to get your prescription updated if your vision changes.  Medicine review. Work with your health care provider to regularly review all of the medicines you are taking, including over-the-counter medicines. Ask your health care provider about any side effects that may make you more likely to have a fall. Tell your health care provider if any medicines that you take make you feel dizzy or sleepy.  Osteoporosis screening. Osteoporosis is a condition that causes the bones to get weaker. This can make the bones weak and cause them to break more easily.  Blood pressure screening. Blood pressure changes and medicines to control blood pressure can make you feel dizzy.  Strength and balance checks.  Your health care provider may recommend certain tests to check your strength and balance while standing, walking, or changing positions.  Foot health exam. Foot pain and numbness, as well as not wearing proper footwear, can make you more likely to have a fall.  Depression screening. You may be more likely to have a fall if you have a fear of falling, feel emotionally low, or feel unable to do activities that you used to do.  Alcohol use screening. Using too much alcohol can affect your balance and may make you more likely to have a fall. What actions can I take to lower my risk of falls? General instructions  Talk with your health care provider about your risks for falling. Tell your health care provider if: ? You fall. Be sure to tell your health care provider about all falls, even ones that seem minor. ? You feel dizzy, sleepy, or off-balance.  Take over-the-counter and prescription medicines only as told by your health care provider. These include any supplements.  Eat a healthy diet and maintain a healthy weight. A healthy diet includes low-fat dairy products, low-fat (lean) meats, and fiber from whole grains, beans, and lots of fruits and vegetables. Home safety  Remove any tripping hazards, such as rugs, cords, and clutter.  Install safety equipment such as grab bars in bathrooms and safety rails on stairs.  Keep rooms and walkways well-lit. Activity   Follow a regular exercise program to stay fit. This will help you maintain your balance. Ask your health care provider what types of exercise are appropriate  for you.  If you need a cane or walker, use it as recommended by your health care provider.  Wear supportive shoes that have nonskid soles. Lifestyle  Do not drink alcohol if your health care provider tells you not to drink.  If you drink alcohol, limit how much you have: ? 0-1 drink a day for women. ? 0-2 drinks a day for men.  Be aware of how much alcohol is in your  drink. In the [place] one drink equals one typical bottle of beer (12 oz), one-half glass of wine (5 oz), or one shot of hard liquor (1 oz).  Do not use any products that contain nicotine or tobacco, such as cigarettes and e-cigarettes. If you need help quitting, ask your health care provider. Summary  Having a healthy lifestyle and getting preventive care can help to protect your health and wellness after age 55.  Screening and testing are the best way to find a health problem early and help you avoid having a fall. Early diagnosis and treatment give you the best chance for managing medical conditions that are more common for people who are older than age 35.  Falls are a major cause of broken bones and head injuries in people who are older than age 3. Take precautions to prevent a fall at home.  Work with your health care provider to learn what changes you can make to improve your health and wellness and to prevent falls. This information is not intended to replace advice given to you by your health care provider. Make sure you discuss any questions you have with your health care provider. Document Released: 02/13/2017 Document Revised: 07/24/2018 Document Reviewed: 02/13/2017 Elsevier Patient Education  2020 [employer].

## 2019-04-01 NOTE — Progress Notes (Signed)
Patient: James Moore, Male    DOB: January 30, 1951, 68 y.o.   MRN: [id number] Visit Date: 04/01/2019  Today's Provider: Trinna Post, PA-C   Chief Complaint  Patient presents with  . Annual Exam   Subjective:     Annual physical exam James Moore is a 68 y.o. male who presents today for health maintenance and complete physical. He feels well. He reports exercising some. He reports he is sleeping well.  Live in [place], [place] with wife of 21 years. Children: step kids grown, aged 44's. Working for Nurse, learning disability. Plan on working another year.  Elevated Blood Pressure  BP Readings from Last 3 Encounters:  04/01/19 (!) 141/90  03/19/19 130/83  03/02/19 (!) 106/59    Colonoscopy: 2017 normal, repeat 10 years.  Pneumonia Vaccine: Due for Prevnar Flu: Due Shingles: Due  -----------------------------------------------------------------   Review of Systems  Constitutional: Negative.   HENT: Negative.   Eyes: Negative.   Respiratory: Negative.   Cardiovascular: Negative.   Gastrointestinal: Positive for anal bleeding. Negative for abdominal distention, abdominal pain, blood in stool, constipation, diarrhea, nausea, rectal pain and vomiting.  Endocrine: Negative.   Genitourinary: Positive for difficulty urinating. Negative for decreased urine volume, discharge, dysuria, enuresis, flank pain, frequency, genital sores, hematuria, penile pain, penile swelling, scrotal swelling, testicular pain and urgency.  Musculoskeletal: Negative.   Skin: Negative.   Allergic/Immunologic: Negative.   Neurological: Negative.   Hematological: Negative.   Psychiatric/Behavioral: Negative.     Social History      He  reports that he has never smoked. He has never used smokeless tobacco. He reports previous alcohol use. He reports that he does not use drugs.       Social History   Socioeconomic History  . Marital status: Married    Spouse name: Not on file  . Number of children:  0  . Years of education: Not on file  . Highest education level: Not on file  Occupational History  . Occupation: Sales  Tobacco Use  . Smoking status: Never Smoker  . Smokeless tobacco: Never Used  Substance and Sexual Activity  . Alcohol use: Not Currently    Alcohol/week: 0.0 standard drinks  . Drug use: No  . Sexual activity: Not on file  Other Topics Concern  . Not on file  Social History Narrative  . Not on file   Social Determinants of Health   Financial Resource Strain:   . Difficulty of Paying Living Expenses: Not on file  Food Insecurity:   . Worried About Charity fundraiser in the Last Year: Not on file  . Ran Out of Food in the Last Year: Not on file  Transportation Needs:   . Lack of Transportation (Medical): Not on file  . Lack of Transportation (Non-Medical): Not on file  Physical Activity:   . Days of Exercise per Week: Not on file  . Minutes of Exercise per Session: Not on file  Stress:   . Feeling of Stress : Not on file  Social Connections:   . Frequency of Communication with Friends and Family: Not on file  . Frequency of Social Gatherings with Friends and Family: Not on file  . Attends Religious Services: Not on file  . Active Member of Clubs or Organizations: Not on file  . Attends Archivist Meetings: Not on file  . Marital Status: Not on file    Past Medical History:  Diagnosis Date  . Colon  polyps   . Hemorrhoid    prn otc hem.cream  . History of MRSA infection   . Sleep apnea    lost weight so dc'd cpap     Patient Active Problem List   Diagnosis Date Noted  . Umbilical hernia without obstruction and without gangrene   . Baker cyst, left 01/10/2018  . History of MRSA infection 06/17/2015  . Bulge of cervical disc without myelopathy 06/17/2015  . Basal cell carcinoma of face 06/17/2015    Past Surgical History:  Procedure Laterality Date  . CATARACT EXTRACTION Left 03/2019  . COLONOSCOPY  2007  . KNEE SURGERY Left      2005-2006- meniscus repair  . POLYPECTOMY    . UMBILICAL HERNIA REPAIR N/A 03/19/2019   Procedure: HERNIA REPAIR UMBILICAL ADULT;  Surgeon: Olean Ree, MD;  Location: [facility] ORS;  Service: General;  Laterality: N/A;    Family History        Family Status  Relation Name Status  . Mother  Deceased  . Father  Deceased  . Neg Hx  (Not Specified)        His family history includes Colon polyps in his mother; Crohn's disease in his mother; Dementia in his father. There is no history of Colon cancer, Rectal cancer, or Stomach cancer.      No Known Allergies   Current Outpatient Medications:  .  albuterol (VENTOLIN HFA) 108 (90 Base) MCG/ACT inhaler, Inhale 2 puffs into the lungs every 6 (six) hours as needed for wheezing or shortness of breath., Disp: 6.7 g, Rfl: 0 .  benzonatate (TESSALON) 200 MG capsule, Take 1 capsule (200 mg total) by mouth 3 (three) times daily as needed. (Patient taking differently: Take 200 mg by mouth 3 (three) times daily as needed for cough. ), Disp: 30 capsule, Rfl: 0 .  ibuprofen (ADVIL) 600 MG tablet, Take 1 tablet (600 mg total) by mouth every 8 (eight) hours as needed for mild pain or moderate pain., Disp: 30 tablet, Rfl: 0 .  Multiple Vitamin (MULTIVITAMIN WITH MINERALS) TABS tablet, Take 2 tablets by mouth daily., Disp: , Rfl:  .  oxyCODONE (OXY IR/ROXICODONE) 5 MG immediate release tablet, Take 1 tablet (5 mg total) by mouth every 4 (four) hours as needed for severe pain., Disp: 30 tablet, Rfl: 0 .  terbinafine (LAMISIL) 250 MG tablet, Take 250 mg by mouth daily., Disp: , Rfl:    Patient Care Team: Birdie Sons, MD as PCP - General (Family Medicine)    Objective:    Vitals: BP (!) 141/90 (BP Location: Right Arm, Patient Position: Sitting, Cuff Size: Large)   Pulse 67   Temp (!) 96.6 F (35.9 C) (Temporal)   Wt 200 lb (90.7 kg)   BMI 26.39 kg/m    Vitals:   04/01/19 1005  BP: (!) 141/90  Pulse: 67  Temp: (!) 96.6 F (35.9 C)  TempSrc:  Temporal  Weight: 200 lb (90.7 kg)     Physical Exam Constitutional:      Appearance: Normal appearance.  Cardiovascular:     Rate and Rhythm: Normal rate and regular rhythm.     Heart sounds: Normal heart sounds.  Pulmonary:     Effort: Pulmonary effort is normal.     Breath sounds: Normal breath sounds.  Abdominal:     General: Bowel sounds are normal.     Palpations: Abdomen is soft.  Skin:    General: Skin is warm and dry.  Neurological:  Mental Status: He is alert and oriented to person, place, and time. Mental status is at baseline.  Psychiatric:        Mood and Affect: Mood normal.        Behavior: Behavior normal.      Depression Screen PHQ 2/9 Scores 04/01/2019 03/22/2017 06/17/2015  PHQ - 2 Score 0 0 0  PHQ- 9 Score 0 - -       Assessment & Plan:     Routine Health Maintenance and Physical Exam  Exercise Activities and Dietary recommendations Goals   None     Immunization History  Administered Date(s) Administered  . Tdap 06/17/2015    Health Maintenance  Topic Date Due  . Hepatitis C Screening  1950-10-08  . HIV Screening  04/15/1968  . PNA vac Low Risk Adult (1 of 2 - PCV13) 04/15/2018  . INFLUENZA VACCINE  11/15/2018  . TETANUS/TDAP  06/16/2025  . COLONOSCOPY  06/30/2025     Discussed health benefits of physical activity, and encouraged him to engage in regular exercise appropriate for his age and condition.    1. Annual physical exam  Screening tests UTD, PSA as below. Counseled he is going to need pneumonia shot and that it is safe to get with influenza. Patient doesn't want to get two vaccinations at once. He wants the flu shot today.   - Lipid Profile  2. Urinary frequency  - PSA  3. Need for influenza vaccination  - Flu Vaccine QUAD High Dose(Fluad)  The entirety of the information documented in the History of Present Illness, Review of Systems and Physical Exam were personally obtained by me. Portions of this information  were initially documented by Ashley Royalty, CMA and reviewed by me for thoroughness and accuracy.   F/u for AWV and 1 year for CPE --------------------------------------------------------------------    Trinna Post, PA-C  [place]

## 2019-04-02 LAB — LIPID PANEL
Chol/HDL Ratio: 3.4 ratio (ref 0.0–5.0)
Cholesterol, Total: 194 mg/dL (ref 100–199)
HDL: 57 mg/dL (ref >39)
LDL Chol Calc (NIH): 125 mg/dL — ABNORMAL HIGH (ref 0–99)
Triglycerides: 65 mg/dL (ref 0–149)
VLDL Cholesterol Cal: 12 mg/dL (ref 5–40)

## 2019-04-02 LAB — PSA: Prostate Specific Ag, Serum: 4.7 ng/mL — ABNORMAL HIGH (ref 0.0–4.0)

## 2019-04-07 ENCOUNTER — Encounter: Payer: Self-pay | Admitting: Physician Assistant

## 2019-04-07 DIAGNOSIS — R972 Elevated prostate specific antigen [PSA]: Secondary | ICD-10-CM

## 2019-05-07 ENCOUNTER — Encounter: Payer: Self-pay | Admitting: Urology

## 2019-05-14 ENCOUNTER — Other Ambulatory Visit: Payer: Self-pay

## 2019-05-14 ENCOUNTER — Encounter: Payer: Self-pay | Admitting: Urology

## 2019-05-14 ENCOUNTER — Ambulatory Visit: Payer: Managed Care, Other (non HMO) | Admitting: Urology

## 2019-05-14 VITALS — BP 149/96 | HR 70 | Ht 73.0 in | Wt 203.0 lb

## 2019-05-14 DIAGNOSIS — N138 Other obstructive and reflux uropathy: Secondary | ICD-10-CM

## 2019-05-14 DIAGNOSIS — R972 Elevated prostate specific antigen [PSA]: Secondary | ICD-10-CM

## 2019-05-14 DIAGNOSIS — N401 Enlarged prostate with lower urinary tract symptoms: Secondary | ICD-10-CM

## 2019-05-14 LAB — BLADDER SCAN AMB NON-IMAGING

## 2019-05-14 MED ORDER — TAMSULOSIN HCL 0.4 MG PO CAPS: 0.4000 mg | ORAL_CAPSULE | Freq: Every day | ORAL | 11 refills | Status: DC

## 2019-05-14 NOTE — Progress Notes (Signed)
05/14/2019 8:52 AM   James Moore 1950/05/09 [id number]  Referring provider: Birdie Sons, MD [address] [place],  [place] [address]  Chief Complaint  Patient presents with  . Elevated PSA    HPI: 69 year old male referred for further evaluation of elevated PSA.  He underwent routine annual labs in December indicating an elevated PSA to 4.7.  Previous PSAs have been within normal limits as below.  He does report that he had Covid in October and has had a very slow recovery.  He wonders if this is related.  No weight loss or bone pain.  No family history of prostate cancer.  In addition as above, he does have obstructive urinary symptoms.  He endorses a weak urinary stream as well as urinary hesitancy.  He reports that when he is voiding, sometimes he feels like his urinary stream suddenly stops and he starts and stops about 3 times on occasion.  Other times he does fine.  No significant frequency or urgency.  IPSS as below.  He is not taking any medications for this.  He is not sufficiently bothered by this to consider pharmacotherapy or surgical intervention.    Component     Latest Ref Rng & Units 06/20/2015 04/01/2019  Prostate Specific Ag, Serum     0.0 - 4.0 ng/mL 3.2 4.7 (H)   PSA 03/2027  2.8  IPSS    Row Name 05/14/19 0800         International Prostate Symptom Score   How often have you had the sensation of not emptying your bladder?  Almost always     How often have you had to urinate less than every two hours?  Not at All     How often have you found you stopped and started again several times when you urinated?  Not at All     How often have you found it difficult to postpone urination?  Less than half the time     How often have you had a weak urinary stream?  About half the time     How often have you had to strain to start urination?  Almost always     How many times did you typically get up at night to urinate?  2 Times     Total IPSS  Score  17       Quality of Life due to urinary symptoms   If you were to spend the rest of your life with your urinary condition just the way it is now how would you feel about that?  Unhappy        Score:  1-7 Mild 8-19 Moderate 20-35 Severe   PMH: Past Medical History:  Diagnosis Date  . Colon polyps   . Hemorrhoid    prn otc hem.cream  . History of MRSA infection   . Sleep apnea    lost weight so dc'd cpap    Surgical History: Past Surgical History:  Procedure Laterality Date  . CATARACT EXTRACTION Left 03/2019  . COLONOSCOPY  2007  . KNEE SURGERY Left    2005-2006- meniscus repair  . POLYPECTOMY    . UMBILICAL HERNIA REPAIR N/A 03/19/2019   Procedure: HERNIA REPAIR UMBILICAL ADULT;  Surgeon: Olean Ree, MD;  Location: [facility] ORS;  Service: General;  Laterality: N/A;    Home Medications:  Allergies as of 05/14/2019   No Known Allergies     Medication List       Accurate  as of May 14, 2019  8:52 AM. If you have any questions, ask your nurse or doctor.        STOP taking these medications   albuterol 108 (90 Base) MCG/ACT inhaler Commonly known as: VENTOLIN HFA Stopped by: Hollice Espy, MD   benzonatate 200 MG capsule Commonly known as: TESSALON Stopped by: Hollice Espy, MD   ibuprofen 600 MG tablet Commonly known as: ADVIL Stopped by: Hollice Espy, MD   oxyCODONE 5 MG immediate release tablet Commonly known as: Oxy IR/ROXICODONE Stopped by: Hollice Espy, MD     TAKE these medications   multivitamin with minerals Tabs tablet Take 2 tablets by mouth daily.   terbinafine 250 MG tablet Commonly known as: LAMISIL Take 250 mg by mouth daily.       Allergies: No Known Allergies  Family History: Family History  Problem Relation Age of Onset  . Crohn's disease Mother   . Colon polyps Mother   . Dementia Father   . Colon cancer Neg Hx   . Rectal cancer Neg Hx   . Stomach cancer Neg Hx     Social History:  reports that he has  never smoked. He has never used smokeless tobacco. He reports previous alcohol use. He reports that he does not use drugs.  ROS: UROLOGY Frequent Urination?: No Hard to postpone urination?: No Burning/pain with urination?: No Get up at night to urinate?: Yes Leakage of urine?: No Urine stream starts and stops?: No Trouble starting stream?: No Do you have to strain to urinate?: Yes Blood in urine?: No Urinary tract infection?: No Sexually transmitted disease?: No Injury to kidneys or bladder?: No Painful intercourse?: No Weak stream?: No Erection problems?: Yes Penile pain?: No  Gastrointestinal Nausea?: No Vomiting?: No Indigestion/heartburn?: No Diarrhea?: No Constipation?: No  Constitutional Fever: No Night sweats?: No Weight loss?: No Fatigue?: No  Skin Itching?: No  Eyes Blurred vision?: No Double vision?: No  Ears/Nose/Throat Sore throat?: No Sinus problems?: No  Hematologic/Lymphatic Swollen glands?: No Easy bruising?: No  Cardiovascular Leg swelling?: No Chest pain?: No  Respiratory Cough?: No Shortness of breath?: No  Endocrine Excessive thirst?: No  Musculoskeletal Back pain?: No Joint pain?: No  Neurological Headaches?: No Dizziness?: No  Psychologic Depression?: No Anxiety?: No  Physical Exam: BP (!) 149/96   Pulse 70   Ht 6\' 1"  (1.854 m)   Wt 203 lb (92.1 kg)   BMI 26.78 kg/m   Constitutional:  Alert and oriented, No acute distress. HEENT: [place] AT, moist mucus membranes.  Trachea midline, no masses. Cardiovascular: No clubbing, cyanosis, or edema. Respiratory: Normal respiratory effort, no increased work of breathing. REctal: Normal sphincter tone.  50 cc prostate, nontender no nodules. Skin: No rashes, bruises or suspicious lesions. Neurologic: Grossly intact, no focal deficits, moving all 4 extremities. Psychiatric: Normal mood and affect.  Laboratory Data: Lab Results  Component Value Date   WBC 6.1 03/02/2019    HGB 13.4 03/02/2019   HCT 39.8 03/02/2019   MCV 83.4 03/02/2019   PLT 188 03/02/2019    Lab Results  Component Value Date   CREATININE 0.90 03/02/2019   PSA trend as above  Pertinent Imaging: Results for orders placed or performed in visit on 05/14/19  Bladder Scan (Post Void Residual) in office  Result Value Ref Range   Scan Result 167ml    PRevoid bladder scan  Urinalysis today is completely clear, no evidence of RBCs or WBCs.  Assessment & Plan:    1. Elevated  PSA  We reviewed the implications of an elevated PSA and the uncertainty surrounding it. In general, a man's PSA increases with age and is produced by both normal and cancerous prostate tissue. Differential for elevated PSA is BPH, prostate cancer, infection, recent intercourse/ejaculation, prostate infarction, recent urethroscopic manipulation (foley placement/cystoscopy) and prostatitis. Management of an elevated PSA can include observation or prostate biopsy and wediscussed this in detail. We discussed that indications for prostate biopsy are defined by age and race specific PSA cutoffs as well as a PSA velocity of 0.75/year.  We will plan to repeat his PSA today especially in the setting of recent acute illness which may be a contributing factor.  If his PSA trends back to baseline, he may continue annual surveillance screening.  If his PSA remains elevated, would recommend prostate biopsy.  We discussed prostate biopsy in detail including the procedure itself, the risks of blood in the urine, stool, and ejaculate, serious infection, and discomfort. He is willing to proceed with this as discussed.  We will call tomorrow with further follow-up and recommendations. - PSA  2. BPH with urinary obstruction  Significant obstructive urinary symptoms although the patient endorses minimal bother from this  We discussed the natural history of prostamegaly and obstructive urinary symptoms.    We discussed pharmacotherapy  versus consideration of outlet surgery.  He will be interested in trial of Flomax, possible side effects including orthostatic hypotension and retrograde ejaculation is the most common were discussed.  - Bladder Scan (Post Void Residual) in office - Urinalysis, Complete   Will call tomorrow with results and recommendations  Hollice Espy, MD  [place] [address], [place], [place] [address] [phone]

## 2019-05-15 ENCOUNTER — Telehealth: Payer: Self-pay | Admitting: *Deleted

## 2019-05-15 LAB — MICROSCOPIC EXAMINATION
Bacteria, UA: NONE SEEN
Epithelial Cells (non renal): NONE SEEN /hpf (ref 0–10)
RBC, Urine: NONE SEEN /hpf (ref 0–2)

## 2019-05-15 LAB — PSA: Prostate Specific Ag, Serum: 3.1 ng/mL (ref 0.0–4.0)

## 2019-05-15 LAB — URINALYSIS, COMPLETE
Bilirubin, UA: NEGATIVE
Glucose, UA: NEGATIVE
Ketones, UA: NEGATIVE
Leukocytes,UA: NEGATIVE
Nitrite, UA: NEGATIVE
Protein,UA: NEGATIVE
RBC, UA: NEGATIVE
Specific Gravity, UA: 1.015 (ref 1.005–1.030)
Urobilinogen, Ur: 0.2 mg/dL (ref 0.2–1.0)
pH, UA: 6 (ref 5.0–7.5)

## 2019-05-15 NOTE — Telephone Encounter (Addendum)
Left a VM to return my call.  ----- Message from Hollice Espy, MD sent at 05/15/2019 10:18 AM EST ----- Wonderful news, your PSA is returned to baseline.  No need for biopsy or any further intervention at this time.  Recommend continued annual PSA testing.In terms of follow-up plan, I would like him to see either myself or PA in about 3 months to reassess his urinary symptoms on Flomax.  This can be virtual if preferred.    Hollice Espy, MD

## 2019-05-19 NOTE — Telephone Encounter (Signed)
Patient called back and left vmail returning call, attempted to reach patient to give message patient did not answer left vmail

## 2019-05-22 NOTE — Telephone Encounter (Signed)
Patient called back, did not want to follow up with a virtual visit in 3 months. States he would call back and let us know how his symptoms are improving.

## 2019-07-18 ENCOUNTER — Encounter: Payer: Self-pay | Admitting: *Deleted

## 2019-07-18 ENCOUNTER — Emergency Department
Admission: EM | Admit: 2019-07-18 | Discharge: 2019-07-18 | Disposition: A | Discharge status: Home / Self Care | Payer: Managed Care, Other (non HMO) | Attending: Emergency Medicine | Admitting: Emergency Medicine

## 2019-07-18 ENCOUNTER — Other Ambulatory Visit: Payer: Self-pay

## 2019-07-18 ENCOUNTER — Emergency Department: Payer: Managed Care, Other (non HMO)

## 2019-07-18 DIAGNOSIS — S61552A Open bite of left wrist, initial encounter: Secondary | ICD-10-CM | POA: Insufficient documentation

## 2019-07-18 DIAGNOSIS — Y9301 Activity, walking, marching and hiking: Secondary | ICD-10-CM | POA: Insufficient documentation

## 2019-07-18 DIAGNOSIS — Y9289 Other specified places as the place of occurrence of the external cause: Secondary | ICD-10-CM | POA: Diagnosis not present

## 2019-07-18 DIAGNOSIS — Z23 Encounter for immunization: Secondary | ICD-10-CM | POA: Insufficient documentation

## 2019-07-18 DIAGNOSIS — Z85828 Personal history of other malignant neoplasm of skin: Secondary | ICD-10-CM | POA: Insufficient documentation

## 2019-07-18 DIAGNOSIS — Y999 Unspecified external cause status: Secondary | ICD-10-CM | POA: Diagnosis not present

## 2019-07-18 DIAGNOSIS — S61452A Open bite of left hand, initial encounter: Secondary | ICD-10-CM | POA: Insufficient documentation

## 2019-07-18 DIAGNOSIS — W540XXA Bitten by dog, initial encounter: Secondary | ICD-10-CM | POA: Insufficient documentation

## 2019-07-18 DIAGNOSIS — S6992XA Unspecified injury of left wrist, hand and finger(s), initial encounter: Secondary | ICD-10-CM | POA: Diagnosis present

## 2019-07-18 DIAGNOSIS — Z79899 Other long term (current) drug therapy: Secondary | ICD-10-CM | POA: Insufficient documentation

## 2019-07-18 MED ORDER — TETANUS-DIPHTH-ACELL PERTUSSIS 5-2.5-18.5 LF-MCG/0.5 IM SUSP
INTRAMUSCULAR | Status: AC
  Filled 2019-07-18: qty 0.5

## 2019-07-18 MED ORDER — AMOXICILLIN-POT CLAVULANATE 875-125 MG PO TABS: 1.0000 | ORAL_TABLET | Freq: Two times a day (BID) | ORAL | 0 refills | Status: AC

## 2019-07-18 MED ORDER — SULFAMETHOXAZOLE-TRIMETHOPRIM 800-160 MG PO TABS: 1.0000 | ORAL_TABLET | Freq: Two times a day (BID) | ORAL | 0 refills | Status: AC

## 2019-07-18 MED ORDER — LIDOCAINE HCL 1 % IJ SOLN: 10.0000 mL | Freq: Once | INTRAMUSCULAR | Status: AC

## 2019-07-18 MED ORDER — LIDOCAINE HCL (PF) 1 % IJ SOLN
INTRAMUSCULAR | Status: AC
  Administered 2019-07-18: 22:00:00 10 mL
  Filled 2019-07-18: qty 10

## 2019-07-18 MED ORDER — TETANUS-DIPHTH-ACELL PERTUSSIS 5-2.5-18.5 LF-MCG/0.5 IM SUSP
0.5000 mL | Freq: Once | INTRAMUSCULAR | Status: AC
  Administered 2019-07-18: 22:00:00 0.5 mL via INTRAMUSCULAR
  Filled 2019-07-18: qty 0.5

## 2019-07-18 NOTE — ED Provider Notes (Signed)
Emergency Department Provider Note  ____________________________________________  Time seen: Approximately 10:03 PM  I have reviewed the triage vital signs and the nursing notes.   HISTORY  Chief Complaint Animal Bite   Historian Patient     HPI James Moore is a 69 y.o. male presents to the emergency department after he was bitten by his neighbors dog.  Patient states that he was walking his dog when neighbors dog tried to attack his dog and he attempted to break up a fight.  Patient has laceration along left thenar eminence and along the dorsal aspect of the left wrist.  Patient states that dog has known rabies negative status.  He denies numbness and tingling in the left hand.   Past Medical History:  Diagnosis Date  . Colon polyps   . Hemorrhoid    prn otc hem.cream  . History of MRSA infection   . Sleep apnea    lost weight so dc'd cpap     Immunizations up to date:  Yes.     Past Medical History:  Diagnosis Date  . Colon polyps   . Hemorrhoid    prn otc hem.cream  . History of MRSA infection   . Sleep apnea    lost weight so dc'd cpap    Patient Active Problem List   Diagnosis Date Noted  . Umbilical hernia without obstruction and without gangrene   . Baker cyst, left 01/10/2018  . History of MRSA infection 06/17/2015  . Bulge of cervical disc without myelopathy 06/17/2015  . Basal cell carcinoma of face 06/17/2015    Past Surgical History:  Procedure Laterality Date  . CATARACT EXTRACTION Left 03/2019  . COLONOSCOPY  2007  . KNEE SURGERY Left    2005-2006- meniscus repair  . POLYPECTOMY    . UMBILICAL HERNIA REPAIR N/A 03/19/2019   Procedure: HERNIA REPAIR UMBILICAL ADULT;  Surgeon: Olean Ree, MD;  Location: [facility] ORS;  Service: General;  Laterality: N/A;    Prior to Admission medications   Medication Sig Start Date End Date Taking? Authorizing Provider  amoxicillin-clavulanate (AUGMENTIN) 875-125 MG tablet Take 1 tablet by mouth 2  (two) times daily for 10 days. 07/18/19 07/28/19  Lannie Fields, PA-C  Multiple Vitamin (MULTIVITAMIN WITH MINERALS) TABS tablet Take 2 tablets by mouth daily.    [provider]  sulfamethoxazole-trimethoprim (BACTRIM DS) 800-160 MG tablet Take 1 tablet by mouth 2 (two) times daily for 7 days. 07/18/19 07/25/19  Lannie Fields, PA-C  tamsulosin (FLOMAX) 0.4 MG CAPS capsule Take 1 capsule (0.4 mg total) by mouth daily. 05/14/19   Hollice Espy, MD  terbinafine (LAMISIL) 250 MG tablet Take 250 mg by mouth daily. 10/26/18   [provider]    Allergies Patient has no known allergies.  Family History  Problem Relation Age of Onset  . Crohn's disease Mother   . Colon polyps Mother   . Dementia Father   . Colon cancer Neg Hx   . Rectal cancer Neg Hx   . Stomach cancer Neg Hx     Social History Social History   Tobacco Use  . Smoking status: Never Smoker  . Smokeless tobacco: Never Used  Substance Use Topics  . Alcohol use: Not Currently    Alcohol/week: 0.0 standard drinks  . Drug use: No     Review of Systems  Constitutional: No fever/chills Eyes:  No discharge ENT: No upper respiratory complaints. Respiratory: no cough. No SOB/ use of accessory muscles to breath Gastrointestinal:  No nausea, no vomiting.  No diarrhea.  No constipation. Musculoskeletal: Patient has left hand pain.  Skin: Patient has dog bite.     ____________________________________________   PHYSICAL EXAM:  VITAL SIGNS: ED Triage Vitals  Enc Vitals Group     BP 07/18/19 1955 (!) 153/95     Pulse Rate 07/18/19 1955 77     Resp 07/18/19 1955 20     Temp 07/18/19 1955 98.7 F (37.1 C)     Temp Source 07/18/19 1955 Oral     SpO2 --      Weight 07/18/19 1957 205 lb (93 kg)     Height 07/18/19 1957 6\' 1"  (1.854 m)     Head Circumference --      Peak Flow --      Pain Score 07/18/19 1956 4     Pain Loc --      Pain Edu? --      Excl. in [place]? --      Constitutional: Alert and  oriented. Well appearing and in no acute distress. Eyes: Conjunctivae are normal. PERRL. EOMI. Head: Atraumatic. Cardiovascular: Normal rate, regular rhythm. Normal S1 and S2.  Good peripheral circulation. Respiratory: Normal respiratory effort without tachypnea or retractions. Lungs CTAB. Good air entry to the bases with no decreased or absent breath sounds Gastrointestinal: Bowel sounds x 4 quadrants. Soft and nontender to palpation. No guarding or rigidity. No distention. Musculoskeletal: Patient is able to perform full range of motion at the left wrist and the left hand. Neurologic:  Normal for age. No gross focal neurologic deficits are appreciated.  Skin: Patient has a 3 cm laceration along the dorsal aspect of the left wrist.  Laceration is deep to underlying adipose tissue.  Patient has a 4.5 cm laceration along the left thenar eminence also deep to underlying adipose tissue. Psychiatric: Mood and affect are normal for age. Speech and behavior are normal.   ____________________________________________   LABS (all labs ordered are listed, but only abnormal results are displayed)  Labs Reviewed - No data to display ____________________________________________  EKG   ____________________________________________  RADIOLOGY Unk Pinto, personally viewed and evaluated these images (plain radiographs) as part of my medical decision making, as well as reviewing the written report by the radiologist.  DG Hand Complete Left  Result Date: 07/18/2019 CLINICAL DATA:  Dog bite EXAM: LEFT HAND - COMPLETE 3+ VIEW COMPARISON:  None. FINDINGS: There is no evidence of fracture or dislocation. There is no evidence of arthropathy or other focal bone abnormality. Soft tissues are unremarkable. IMPRESSION: No fracture or dislocation of the left hand. No radiopaque foreign body. Electronically Signed   By: Eddie Candle M.D.   On: 07/18/2019 20:32     ____________________________________________    PROCEDURES  Procedure(s) performed:     Marland KitchenMarland KitchenLaceration Repair  Date/Time: 07/18/2019 10:05 PM Performed by: Lannie Fields, PA-C Authorized by: Lannie Fields, PA-C   Consent:    Consent obtained:  Verbal   Consent given by:  Patient   Risks discussed:  Infection, pain, retained foreign body, poor cosmetic result and poor wound healing Anesthesia (see MAR for exact dosages):    Anesthesia method:  Local infiltration   Local anesthetic:  Lidocaine 1% w/o epi Laceration details:    Location:  Hand   Hand location:  L palm   Length (cm):  4   Depth (mm):  1.5 Repair type:    Repair type:  Simple Exploration:    Hemostasis achieved with:  Direct pressure   Wound exploration: entire depth of wound probed and visualized     Contaminated: no   Treatment:    Area cleansed with:  Saline   Amount of cleaning:  Extensive   Irrigation solution:  Sterile saline   Visualized foreign bodies/material removed: no   Skin repair:    Repair method:  Sutures   Suture size:  4-0   Suture material:  Nylon   Suture technique:  Simple interrupted   Number of sutures:  5 Approximation:    Approximation:  Close Post-procedure details:    Dressing:  Sterile dressing   Patient tolerance of procedure:  Tolerated well, no immediate complications .Marland KitchenLaceration Repair  Date/Time: 07/18/2019 10:07 PM Performed by: Lannie Fields, PA-C Authorized by: Lannie Fields, PA-C   Consent:    Consent obtained:  Verbal   Consent given by:  Patient   Risks discussed:  Infection, pain, retained foreign body, poor cosmetic result and poor wound healing Anesthesia (see MAR for exact dosages):    Anesthesia method:  Local infiltration   Local anesthetic:  Lidocaine 1% w/o epi Laceration details:    Location:  Shoulder/arm   Shoulder/arm location:  L lower arm   Length (cm):  3   Depth (mm):  1 Repair type:    Repair type:  Simple Exploration:     Hemostasis achieved with:  Direct pressure   Wound exploration: entire depth of wound probed and visualized     Contaminated: no   Treatment:    Area cleansed with:  Saline   Amount of cleaning:  Extensive   Irrigation solution:  Sterile saline   Irrigation volume:  500   Visualized foreign bodies/material removed: no   Skin repair:    Repair method:  Sutures   Suture size:  4-0   Suture technique:  Simple interrupted   Number of sutures:  5 Approximation:    Approximation:  Close Post-procedure details:    Dressing:  Sterile dressing   Patient tolerance of procedure:  Tolerated well, no immediate complications       Medications  Tdap (BOOSTRIX) injection 0.5 mL (has no administration in time range)  lidocaine (XYLOCAINE) 1 % (with pres) injection 10 mL (10 mLs Infiltration Given by Other 07/18/19 2201)     ____________________________________________   INITIAL IMPRESSION / ASSESSMENT AND PLAN / ED COURSE  Pertinent labs & imaging results that were available during my care of the patient were reviewed by me and considered in my medical decision making (see chart for details).      Assessment and Plan: Dog bite 69 year old male presents to the emergency department with a dog bite.  Patient was hypertensive at triage.  Patient had dog bite wound along the dorsal aspect of the left wrist and the thenar eminence.  Due to depth and adipose tissue exposure, complicated nature of lacerations warranted closure with sutures.  Caution patient that he is at increased risk for infection given dog bite wound and need for sutures.  Patient education was given regarding signs and symptoms of infection and patient was given strict return precautions if he notices redness or streaking surrounding suture sites or expression of purulent exudate.  X-ray examination of the left hand revealed no retained foreign bodies or acute fractures.  Dog is available to be quarantined for observation  for signs and symptoms of rabies and patient elected to have animal observed rather than initiating rabies vaccination series.  Patient was discharged with Augmentin and  Bactrim.  Sutures were advised to be removed in 7 days.  Patient's tetanus status was updated in the emergency department.   ____________________________________________  FINAL CLINICAL IMPRESSION(S) / ED DIAGNOSES  Final diagnoses:  Dog bite, initial encounter      NEW MEDICATIONS STARTED DURING THIS VISIT:  ED Discharge Orders         Ordered    amoxicillin-clavulanate (AUGMENTIN) 875-125 MG tablet  2 times daily     07/18/19 2143    sulfamethoxazole-trimethoprim (BACTRIM DS) 800-160 MG tablet  2 times daily     07/18/19 2143              This chart was dictated using voice recognition software/Dragon. Despite best efforts to proofread, errors can occur which can change the meaning. Any change was purely unintentional.     Lannie Fields, PA-C 07/18/19 2211    Duffy Bruce, MD 07/19/19 [phone]

## 2019-07-18 NOTE — ED Notes (Signed)
Pt reports his neighbor's dog escaped the house and attacked his dog, and when he tried to intervene, the neighbor's dog "latched onto" his left hand   Pt reports that the dog is up to date on his shots per the owner. NAD, no active bleeding at this time

## 2019-07-18 NOTE — ED Triage Notes (Signed)
Pt walking dog, bitten by neighbor's dog when neighbor's dog attempted to attack pt's dog.

## 2019-07-18 NOTE — Discharge Instructions (Addendum)
Take Bactrim twice daily for the next week.  Take Augmentin twice daily for the next ten days.  Have sutures removed in 7 days.  Come back to ED with redness around suture sites.

## 2019-10-02 ENCOUNTER — Ambulatory Visit: Payer: Managed Care, Other (non HMO) | Admitting: Family Medicine

## 2020-01-29 ENCOUNTER — Ambulatory Visit: Payer: Self-pay | Admitting: *Deleted

## 2020-01-29 NOTE — Telephone Encounter (Signed)
Patient states he fell when walking his dog(3 months ago) and injured his sternum. Patient reports he is not getting better and still have that sternal pain. Offered appointment- patient is working out of town and appointment has been scheduled at his availability advised to call back for increasing symptoms  Reason for Disposition . [1] High-risk adult (e.g., age > 2 years, osteoporosis, chronic steroid use) AND [2] still hurts  Answer Assessment - Initial Assessment Questions 1. MECHANISM: "How did the injury happen?"     Fell on dog while walking him- 3 months ago 2. ONSET: "When did the injury happen?" (Minutes or hours ago)     3 months ago 3. LOCATION: "Where on the chest is the injury located?"     Base of sternum 4. APPEARANCE: "What does the injury look like?"     No prolonged swelling 5. BLEEDING: "Is there any bleeding now? If Yes, ask: How long has it been bleeding?"     no 6. SEVERITY: "Any difficulty with breathing?"     Soreness deep in chest- no SOB 7. SIZE: For cuts, bruises, or swelling, ask: "How large is it?" (e.g., inches or centimeters)     no 8. PAIN: "Is there pain?" If Yes, ask: "How bad is the pain?"   (e.g., Scale 1-10; or mild, moderate, severe)     Yes- catches patient at times and feels like deep irritation/inflamtion 9. TETANUS: For any breaks in the skin, ask: "When was the last tetanus booster?"     n/a 10. PREGNANCY: "Is there any chance you are pregnant?" "When was your last menstrual period?"       n/a  Protocols used: CHEST INJURY-A-AH

## 2020-02-05 NOTE — Patient Instructions (Addendum)
.   Please review the attached list of medications and notify my office if there are any errors.   Go to the [facility] on [facility] for chest  Xray

## 2020-02-08 ENCOUNTER — Other Ambulatory Visit: Payer: Self-pay

## 2020-02-08 ENCOUNTER — Encounter: Payer: Self-pay | Admitting: Family Medicine

## 2020-02-08 ENCOUNTER — Ambulatory Visit
Admission: RE | Admit: 2020-02-08 | Discharge: 2020-02-08 | Disposition: A | Discharge status: Home / Self Care | Payer: Managed Care, Other (non HMO) | Source: Ambulatory Visit | Attending: Family Medicine | Admitting: Family Medicine

## 2020-02-08 ENCOUNTER — Ambulatory Visit
Admission: RE | Admit: 2020-02-08 | Discharge: 2020-02-08 | Disposition: A | Discharge status: Home / Self Care | Payer: Managed Care, Other (non HMO) | Attending: Family Medicine | Admitting: Family Medicine

## 2020-02-08 ENCOUNTER — Ambulatory Visit: Payer: Managed Care, Other (non HMO) | Admitting: Family Medicine

## 2020-02-08 VITALS — BP 136/89 | HR 72 | Temp 98.8°F | Resp 16 | Wt 207.4 lb

## 2020-02-08 DIAGNOSIS — I456 Pre-excitation syndrome: Secondary | ICD-10-CM | POA: Diagnosis not present

## 2020-02-08 DIAGNOSIS — R0789 Other chest pain: Secondary | ICD-10-CM | POA: Diagnosis not present

## 2020-02-08 DIAGNOSIS — R002 Palpitations: Secondary | ICD-10-CM | POA: Diagnosis not present

## 2020-02-08 NOTE — Progress Notes (Signed)
Established patient visit   Patient: James Moore   DOB: [birth year]   69 y.o. Male  MRN: [id number] Visit Date: 02/08/2020  Today's healthcare provider: Lelon Huh, MD   Chief Complaint  Patient presents with  . Fall  . Chest Pain   Subjective    Chest Pain  Chronicity: 3 months ago after the fall. Episode onset: 3 months ago. Onset quality: comes and goes. The problem occurs intermittently. The problem has been unchanged. Pain location: sternum region. Quality: bruised. The pain does not radiate. Pertinent negatives include no cough, fever, headaches, numbness, vomiting or weakness.   He states he has 'weird' sensation in his chest off and on since having Covid last year. Feels like a cramp sometimes, sometimes likely his heart gets out of rhythm. Sometimes triggered be turning or twisting upper body, or reaching around with his arms. No dyspnea. Symptoms not exertion, not exacerbated by taking deep breaths or coughing.  Patient states he fell when walking his dog(3 months ago) and injured his sternum. Patient reports he is not getting better and still have that sternal pain.       Medications: Outpatient Medications Prior to Visit  Medication Sig  . Multiple Vitamin (MULTIVITAMIN WITH MINERALS) TABS tablet Take 2 tablets by mouth daily.  . tamsulosin (FLOMAX) 0.4 MG CAPS capsule Take 1 capsule (0.4 mg total) by mouth daily.  Marland Kitchen terbinafine (LAMISIL) 250 MG tablet Take 250 mg by mouth daily.   No facility-administered medications prior to visit.    Review of Systems  Constitutional: Negative for fever.  Respiratory: Negative for cough.   Cardiovascular: Positive for chest pain.  Gastrointestinal: Negative for vomiting.  Neurological: Negative for weakness, numbness and headaches.       Objective    BP 136/89 (BP Location: Left Arm, Patient Position: Sitting, Cuff Size: Large)   Pulse 72   Temp 98.8 F (37.1 C) (Oral)   Resp 16   Wt 207 lb 6.4 oz (94.1 kg)    BMI 27.36 kg/m   Physical Exam   General: Appearance:     Well developed, well nourished male in no acute distress  Eyes:    PERRL, conjunctiva/corneas clear, EOM's intact       Lungs:     Clear to auscultation bilaterally, respirations unlabored  Heart:    Normal heart rate. Normal rhythm. No murmurs, rubs, or gallops.   MS:   All extremities are intact.   Neurologic:   Awake, alert, oriented x 3. No apparent focal neurological           defect.        EKG: Sinus Rhythm -Short PR syndrome  PRi = 110  -Left axis -anterior fascicular block.  -Nonspecific ST depression -Nondiagnostic.    Assessment & Plan     1. Other chest pain  Very non-specific, but not consistent with cardiac angina.  - EKG 12-Lead - DG Chest 2 View; Future  2. Short PR-normal QRS complex syndrome  - Holter monitor - 48 hour; Future  3. Palpitations  - Holter monitor - 48 hour; Future       The entirety of the information documented in the History of Present Illness, Review of Systems and Physical Exam were personally obtained by me. Portions of this information were initially documented by the CMA and reviewed by me for thoroughness and accuracy.      Lelon Huh, MD  [facility] [phone] (phone) [phone] (fax)  [facility]

## 2020-02-16 ENCOUNTER — Ambulatory Visit: Admission: RE | Admit: 2020-02-16 | Payer: Managed Care, Other (non HMO) | Source: Ambulatory Visit

## 2020-06-13 ENCOUNTER — Ambulatory Visit (INDEPENDENT_AMBULATORY_CARE_PROVIDER_SITE_OTHER): Payer: Managed Care, Other (non HMO) | Admitting: Dermatology

## 2020-06-13 ENCOUNTER — Other Ambulatory Visit: Payer: Self-pay

## 2020-06-13 DIAGNOSIS — L578 Other skin changes due to chronic exposure to nonionizing radiation: Secondary | ICD-10-CM

## 2020-06-13 DIAGNOSIS — Z85828 Personal history of other malignant neoplasm of skin: Secondary | ICD-10-CM | POA: Diagnosis not present

## 2020-06-13 DIAGNOSIS — D1800 Hemangioma unspecified site: Secondary | ICD-10-CM

## 2020-06-13 DIAGNOSIS — L820 Inflamed seborrheic keratosis: Secondary | ICD-10-CM | POA: Diagnosis not present

## 2020-06-13 DIAGNOSIS — Z1283 Encounter for screening for malignant neoplasm of skin: Secondary | ICD-10-CM

## 2020-06-13 DIAGNOSIS — L570 Actinic keratosis: Secondary | ICD-10-CM | POA: Diagnosis not present

## 2020-06-13 DIAGNOSIS — L821 Other seborrheic keratosis: Secondary | ICD-10-CM | POA: Diagnosis not present

## 2020-06-13 DIAGNOSIS — L814 Other melanin hyperpigmentation: Secondary | ICD-10-CM

## 2020-06-13 DIAGNOSIS — D229 Melanocytic nevi, unspecified: Secondary | ICD-10-CM

## 2020-06-13 DIAGNOSIS — Z86018 Personal history of other benign neoplasm: Secondary | ICD-10-CM

## 2020-06-13 DIAGNOSIS — B351 Tinea unguium: Secondary | ICD-10-CM

## 2020-06-13 DIAGNOSIS — I8390 Asymptomatic varicose veins of unspecified lower extremity: Secondary | ICD-10-CM

## 2020-06-13 MED ORDER — TERBINAFINE HCL 250 MG PO TABS: 250.0000 mg | ORAL_TABLET | Freq: Every day | ORAL | 0 refills | Status: DC

## 2020-06-13 NOTE — Progress Notes (Signed)
Follow-Up Visit   Subjective  James Moore is a 70 y.o. male who presents for the following: Annual Exam (Hx BCC, dysplastic nevi ). Patient has noticed a lesion in his scalp that he would like checked.  The patient presents for Total-Body Skin Exam (TBSE) for skin cancer screening and mole check.  The following portions of the chart were reviewed this encounter and updated as appropriate:   Tobacco  Allergies  Meds  Problems  Med Hx  Surg Hx  Fam Hx     Review of Systems:  No other skin or systemic complaints except as noted in HPI or Assessment and Plan.  Objective  Well appearing patient in no apparent distress; mood and affect are within normal limits.  A full examination was performed including scalp, head, eyes, ears, nose, lips, neck, chest, axillae, abdomen, back, buttocks, bilateral upper extremities, bilateral lower extremities, hands, feet, fingers, toes, fingernails, and toenails. All findings within normal limits unless otherwise noted below.  Objective  R vertex scalp: 0.8 cm brown flat papule   Objective  Face: Erythematous thin papules/macules with gritty scale.   Objective  L lat bicep x 1, L thigh x 1, L buttocks x 1 (3): Erythematous keratotic or waxy stuck-on papule or plaque.   Objective  L great toenail: Toenail dystrophy   Assessment & Plan  Seborrheic keratosis R vertex scalp Benign-appearing.  Observation.  Call clinic for new or changing lesions.  Recommend daily use of broad spectrum spf 30+ sunscreen to sun-exposed areas.    AK (actinic keratosis) Face Patient declines treatment today since he has meetings later in the week. He will schedule later to treat.  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 Restart Terbinafine 250mg  po QD x 1 month.  terbinafine (LAMISIL) 250 MG tablet - L great toenail  Skin cancer screening   Lentigines - Scattered tan macules - Due to sun exposure - Benign-appering, observe - Recommend daily broad spectrum sunscreen SPF 30+ to sun-exposed areas, reapply every 2 hours as needed. - Call for any changes  Seborrheic Keratoses - Stuck-on, waxy, tan-brown papules and plaques  - Discussed benign etiology and prognosis. - Observe - Call for any changes  Melanocytic Nevi - Tan-brown and/or pink-flesh-colored symmetric macules and papules - Benign appearing on exam today - Observation - Call clinic for new or changing moles - Recommend daily use of broad spectrum spf 30+ sunscreen to sun-exposed areas.   Hemangiomas - Red papules - Discussed benign nature - Observe - Call for any changes  Actinic Damage - Chronic, secondary to cumulative UV/sun exposure - diffuse scaly erythematous macules with underlying dyspigmentation - Recommend daily broad spectrum sunscreen SPF 30+ to sun-exposed areas, reapply every 2 hours as needed.  - Call for new or changing lesions.  History of Basal Cell Carcinoma of the Skin - No evidence of recurrence today - Recommend regular full body  skin exams - Recommend daily broad spectrum sunscreen SPF 30+ to sun-exposed areas, reapply every 2 hours as needed.  - Call if any new or changing lesions are noted between office visits  History of Dysplastic Nevi - No evidence of recurrence today - Recommend regular full body skin exams - Recommend daily broad spectrum sunscreen SPF 30+ to  sun-exposed areas, reapply every 2 hours as needed.  - Call if any new or changing lesions are noted between office visits  Severe, Confluent Chronic Actinic Changes with Pre-Cancerous Actinic Keratoses due to cumulative sun exposure/UV radiation exposure over time - Discussed Prescription "Field Treatment" Field treatment involves treatment of an entire area of skin that has confluent Actinic Changes (Sun/ Ultraviolet light damage) and PreCancerous Actinic Keratoses by method of PhotoDynamic Therapy (PDT) and/or prescription Topical Chemotherapy agents such as 5-fluorouracil, 5-fluorouracil/calcipotriene, and/or imiquimod.  The purpose is to decrease the number of clinically evident and subclinical PreCancerous lesions to prevent progression to development of skin cancer by chemically destroying early precancer changes that may or may not be visible.  It has been shown to reduce the risk of developing skin cancer in the treated area. As a result of treatment, redness, scaling, crusting, and open sores may occur during treatment course. One or more than one of these methods may be used and may have to be used several times to control, suppress and eliminate the PreCancerous changes. Discussed treatment course, expected reaction, and possible side effects.  Spider Veins  - Dilated blue, purple or red veins at the lower extremities - Reassured - These can be treated by sclerotherapy (a procedure to inject a medicine into the veins to make them disappear) if desired, but the treatment is not covered by insurance  Skin cancer screening performed today.  Return in about 1 year (around 06/13/2021) for TBSE - hx BCC, dysplastic nevi .   Luther Redo, CMA, am acting as scribe for Sarina Ser, MD .  Documentation: I have reviewed the above documentation for accuracy and completeness, and I agree with the above.  Sarina Ser, MD

## 2020-06-14 ENCOUNTER — Encounter: Payer: Self-pay | Admitting: Dermatology

## 2020-09-26 ENCOUNTER — Ambulatory Visit: Payer: Managed Care, Other (non HMO) | Admitting: Dermatology

## 2021-01-19 ENCOUNTER — Ambulatory Visit: Payer: Managed Care, Other (non HMO) | Admitting: Dermatology

## 2021-01-19 ENCOUNTER — Other Ambulatory Visit: Payer: Self-pay

## 2021-01-19 DIAGNOSIS — L821 Other seborrheic keratosis: Secondary | ICD-10-CM

## 2021-01-19 DIAGNOSIS — L578 Other skin changes due to chronic exposure to nonionizing radiation: Secondary | ICD-10-CM | POA: Diagnosis not present

## 2021-01-19 DIAGNOSIS — L820 Inflamed seborrheic keratosis: Secondary | ICD-10-CM

## 2021-01-19 DIAGNOSIS — L570 Actinic keratosis: Secondary | ICD-10-CM

## 2021-01-19 NOTE — Progress Notes (Signed)
Follow-Up Visit   Subjective  James Moore is a 70 y.o. male who presents for the following: Follow-up (Patient here today to have some spots checked on scalp and right temple. Patient had some areas treated with Dr. Nehemiah Massed at last visit but declined some due to meetings he was scheduled for. Also has a spot at scalp that is sore that he thinks may be a cyst. ).   The following portions of the chart were reviewed this encounter and updated as appropriate:   Tobacco  Allergies  Meds  Problems  Med Hx  Surg Hx  Fam Hx      Review of Systems:  No other skin or systemic complaints except as noted in HPI or Assessment and Plan.  Objective  Well appearing patient in no apparent distress; mood and affect are within normal limits.  A focused examination was performed including scalp, face. Relevant physical exam findings are noted in the Assessment and Plan.  right frontal scalp x 1 Erythematous keratotic or waxy stuck-on papule or plaque.   mid frontal scalp x 1, right forehead x 1, left brow x 1, right dorsal hand x 1, left hand x 4 (8) Erythematous thin papules/macules with gritty scale, hypertrophic at mid frontal scalp   Assessment & Plan  Inflamed seborrheic keratosis right frontal scalp x 1  Symptomatic  Prior to procedure, discussed risks of blister formation, small wound, skin dyspigmentation, or rare scar following cryotherapy. Recommend Vaseline ointment to treated areas while healing.   Destruction of lesion - right frontal scalp x 1  Destruction method: cryotherapy   Informed consent: discussed and consent obtained   Lesion destroyed using liquid nitrogen: Yes   Cryotherapy cycles:  2 Outcome: patient tolerated procedure well with no complications   Post-procedure details: wound care instructions given    AK (actinic keratosis) (8) mid frontal scalp x 1, right forehead x 1, left brow x 1, right dorsal hand x 1, left hand x 4  Prior to procedure,  discussed risks of blister formation, small wound, skin dyspigmentation, or rare scar following cryotherapy.   Recommend Vaseline ointment to treated areas while healing.    Destruction of lesion - mid frontal scalp x 1, right forehead x 1, left brow x 1, right dorsal hand x 1, left hand x 4  Destruction method: cryotherapy   Informed consent: discussed and consent obtained   Lesion destroyed using liquid nitrogen: Yes   Cryotherapy cycles:  2 Outcome: patient tolerated procedure well with no complications   Post-procedure details: wound care instructions given    Seborrheic Keratoses - Stuck-on, waxy, tan-brown papules and/or plaques  - Benign-appearing - Discussed benign etiology and prognosis. - Observe - Call for any changes  Actinic Damage - Severe, confluent actinic changes with pre-cancerous actinic keratoses  - Severe, chronic, not at goal, secondary to cumulative UV radiation exposure over time - diffuse scaly erythematous macules and papules with underlying dyspigmentation - Discussed Prescription "Field Treatment" for Severe, Chronic Confluent Actinic Changes with Pre-Cancerous Actinic Keratoses Field treatment involves treatment of an entire area of skin that has confluent Actinic Changes (Sun/ Ultraviolet light damage) and PreCancerous Actinic Keratoses by method of PhotoDynamic Therapy (PDT) and/or prescription Topical Chemotherapy agents such as 5-fluorouracil, 5-fluorouracil/calcipotriene, and/or imiquimod.  The purpose is to decrease the number of clinically evident and subclinical PreCancerous lesions to prevent progression to development of skin cancer by chemically destroying early precancer changes that may or may not be visible.  It has  been shown to reduce the risk of developing skin cancer in the treated area. As a result of treatment, redness, scaling, crusting, and open sores may occur during treatment course. One or more than one of these methods may be used and  may have to be used several times to control, suppress and eliminate the PreCancerous changes. Discussed treatment course, expected reaction, and possible side effects. - Recommend daily broad spectrum sunscreen SPF 30+ to sun-exposed areas, reapply every 2 hours as needed.  - Staying in the shade or wearing long sleeves, sun glasses (UVA+UVB protection) and wide brim hats (4-inch brim around the entire circumference of the hat) are also recommended. - Call for new or changing lesions. - Patient defers treatment for broader damage and would like to wait until retired Plan PDT scalp, face after retirement in a few months  Return for PDT face, scalp, 6 month TBSE.  Graciella Belton, RMA, am acting as scribe for Forest Gleason, MD .  Documentation: I have reviewed the above documentation for accuracy and completeness, and I agree with the above.  Forest Gleason, MD

## 2021-01-19 NOTE — Patient Instructions (Signed)
Cryotherapy Aftercare  Wash gently with soap and water everyday.   Apply Vaseline and Band-Aid daily until healed.   Prior to procedure, discussed risks of blister formation, small wound, skin dyspigmentation, or rare scar following cryotherapy. Recommend Vaseline ointment to treated areas while healing.  Recommend daily broad spectrum sunscreen SPF 30+ to sun-exposed areas, reapply every 2 hours as needed. Call for new or changing lesions.  Staying in the shade or wearing long sleeves, sun glasses (UVA+UVB protection) and wide brim hats (4-inch brim around the entire circumference of the hat) are also recommended for sun protection.   If you have any questions or concerns for your doctor, please call our main line at [phone] and press option 4 to reach your doctor's medical assistant. If no one answers, please leave a voicemail as directed and we will return your call as soon as possible. Messages left after 4 pm will be answered the following business day.   You may also send us a message via MyChart. We typically respond to MyChart messages within 1-2 business days.  For prescription refills, please ask your pharmacy to contact our office. Our fax number is [phone].  If you have an urgent issue when the clinic is closed that cannot wait until the next business day, you can page your doctor at the number below.    Please note that while we do our best to be available for urgent issues outside of office hours, we are not available 24/7.   If you have an urgent issue and are unable to reach us, you may choose to seek medical care at your doctor's office, retail clinic, urgent care center, or emergency room.  If you have a medical emergency, please immediately call 911 or go to the emergency department.  Pager Numbers  - Dr. Kowalski: [phone]  - Dr. Moye: [phone]  - Dr. Stewart: [phone]  In the event of inclement weather, please call our main line at  [phone] for an update on the status of any delays or closures.  Dermatology Medication Tips: Please keep the boxes that topical medications come in in order to help keep track of the instructions about where and how to use these. Pharmacies typically print the medication instructions only on the boxes and not directly on the medication tubes.   If your medication is too expensive, please contact our office at [phone] option 4 or send us a message through MyChart.   We are unable to tell what your co-pay for medications will be in advance as this is different depending on your insurance coverage. However, we may be able to find a substitute medication at lower cost or fill out paperwork to get insurance to cover a needed medication.   If a prior authorization is required to get your medication covered by your insurance company, please allow us 1-2 business days to complete this process.  Drug prices often vary depending on where the prescription is filled and some pharmacies may offer cheaper prices.  The website www.goodrx.com contains coupons for medications through different pharmacies. The prices here do not account for what the cost may be with help from insurance (it may be cheaper with your insurance), but the website can give you the price if you did not use any insurance.  - You can print the associated coupon and take it with your prescription to the pharmacy.  - You may also stop by our office during regular business hours and pick up a GoodRx coupon card.  -   If you need your prescription sent electronically to a different pharmacy, notify our office through [facility] MyChart or by phone at [phone] option 4.  

## 2021-01-31 ENCOUNTER — Encounter: Payer: Self-pay | Admitting: General Surgery

## 2021-02-01 ENCOUNTER — Encounter: Payer: Self-pay | Admitting: Dermatology

## 2021-04-24 ENCOUNTER — Ambulatory Visit: Payer: Managed Care, Other (non HMO) | Admitting: Dermatology

## 2021-06-27 ENCOUNTER — Ambulatory Visit: Payer: Managed Care, Other (non HMO) | Admitting: Family Medicine

## 2021-06-27 ENCOUNTER — Encounter: Payer: Self-pay | Admitting: Family Medicine

## 2021-06-27 ENCOUNTER — Other Ambulatory Visit: Payer: Self-pay

## 2021-06-27 VITALS — BP 137/93 | HR 68 | Temp 98.2°F | Resp 14 | Wt 210.0 lb

## 2021-06-27 DIAGNOSIS — J329 Chronic sinusitis, unspecified: Secondary | ICD-10-CM | POA: Diagnosis not present

## 2021-06-27 DIAGNOSIS — R051 Acute cough: Secondary | ICD-10-CM

## 2021-06-27 MED ORDER — HYDROCODONE BIT-HOMATROP MBR 5-1.5 MG/5ML PO SOLN: 5.0000 mL | Freq: Three times a day (TID) | ORAL | 0 refills | Status: AC | PRN

## 2021-06-27 MED ORDER — AZITHROMYCIN 250 MG PO TABS: ORAL_TABLET | ORAL | 0 refills | Status: AC

## 2021-06-27 NOTE — Patient Instructions (Signed)
Please review the attached list of medications and notify my office if there are any errors.  ? ?Please bring all of your medications to every appointment so we can make sure that our medication list is the same as yours.  ? ?It is recommended to engage in 150 minutes of moderate exercise every week.   ?

## 2021-06-27 NOTE — Progress Notes (Signed)
?  ? ?I,Roshena L Chambers,acting as a scribe for Lelon Huh, MD.,have documented all relevant documentation on the behalf of Lelon Huh, MD,as directed by  Lelon Huh, MD while in the presence of Lelon Huh, MD.  ? ? ?Established patient visit ? ? ?Patient: James Moore   DOB: 08/02/1950   71 y.o. Male  MRN: [id number] ?Visit Date: 06/27/2021 ? ?Today's healthcare provider: Lelon Huh, MD  ? ?Chief Complaint  ?Patient presents with  ? Cough  ? ?Subjective  ?  ?Cough ?This is a new problem. Episode onset: 4 days ago. The problem has been gradually worsening. The cough is Productive of sputum (thick green cream colored sputum). Associated symptoms include postnasal drip and rhinorrhea. Pertinent negatives include no chest pain, chills, eye redness, fever, sore throat, shortness of breath or wheezing. The symptoms are aggravated by lying down. He has tried OTC cough suppressant (nasal lavage) for the symptoms. The treatment provided mild relief.   ?Cough worsens at night. Patient has taken a home COVID test daily since onset of symptoms and each test result was negative.   ? ?Medications: ?Outpatient Medications Prior to Visit  ?Medication Sig  ? Multiple Vitamin (MULTIVITAMIN WITH MINERALS) TABS tablet Take 2 tablets by mouth daily.  ? tamsulosin (FLOMAX) 0.4 MG CAPS capsule Take 1 capsule (0.4 mg total) by mouth daily. (Patient not taking: Reported on 06/13/2020)  ? [DISCONTINUED] terbinafine (LAMISIL) 250 MG tablet Take 250 mg by mouth daily.  ? [DISCONTINUED] terbinafine (LAMISIL) 250 MG tablet Take 1 tablet (250 mg total) by mouth daily. (Patient not taking: Reported on 06/27/2021)  ? ?No facility-administered medications prior to visit.  ? ? ?Review of Systems  ?Constitutional:  Positive for fatigue. Negative for appetite change, chills, diaphoresis and fever.  ?HENT:  Positive for congestion (nasal congestion), postnasal drip, rhinorrhea and sneezing. Negative for sore throat.   ?Eyes:  Negative  for pain, discharge, redness and itching.  ?Respiratory:  Positive for cough. Negative for chest tightness, shortness of breath and wheezing.   ?Cardiovascular:  Negative for chest pain and palpitations.  ?Gastrointestinal:  Negative for abdominal pain, nausea and vomiting.  ? ? ?  Objective  ?  ?BP (!) 137/93 (BP Location: Left Arm, Patient Position: Sitting)   Pulse 68   Temp 98.2 ?F (36.8 ?C) (Oral)   Resp 14   Wt 210 lb (95.3 kg)   SpO2 100%   BMI 27.71 kg/m?  ? ? ?Physical Exam  ? ?General Appearance:     ?Overweight male, alert, cooperative, in no acute distress  ?HENT:   bilateral TM normal without fluid or infection, neck without nodes, throat normal without erythema or exudate, frontal sinus tender, post nasal drip noted, and nasal mucosa pale and congested  ?Eyes:    PERRL, conjunctiva/corneas clear, EOM's intact       ?Lungs:     Clear to auscultation bilaterally, respirations unlabored  ?Heart:    Normal heart rate. Normal rhythm. No murmurs, rubs, or gallops.    ?Neurologic:   Awake, alert, oriented x 3. No apparent focal neurological           defect.   ?   ?  ? Assessment & Plan  ?  ? ?1. Acute cough ?- HYDROcodone bit-homatropine (HYCODAN) 5-1.5 MG/5ML syrup; Take 5 mLs by mouth every 8 (eight) hours as needed for up to 10 days for cough.  Dispense: 120 mL; Refill: 0 ? ?2. Sinusitis, unspecified chronicity, unspecified location ? ?- azithromycin (ZITHROMAX)  250 MG tablet; Take 2 tablets on day 1, then 1 tablet daily on days 2 through 5  Dispense: 6 tablet; Refill: 0  ? ?  ?   ? ?The entirety of the information documented in the History of Present Illness, Review of Systems and Physical Exam were personally obtained by me. Portions of this information were initially documented by the CMA and reviewed by me for thoroughness and accuracy.   ? ? ?Lelon Huh, MD  ?[facility] ?[phone] (phone) ?[phone] (fax) ? ?[facility]  ?

## 2021-07-10 ENCOUNTER — Ambulatory Visit
Admission: RE | Admit: 2021-07-10 | Discharge: 2021-07-10 | Disposition: A | Discharge status: Home / Self Care | Payer: Managed Care, Other (non HMO) | Source: Ambulatory Visit | Attending: Family Medicine | Admitting: Family Medicine

## 2021-07-10 ENCOUNTER — Other Ambulatory Visit: Payer: Self-pay

## 2021-07-10 ENCOUNTER — Ambulatory Visit
Admission: RE | Admit: 2021-07-10 | Discharge: 2021-07-10 | Disposition: A | Discharge status: Home / Self Care | Payer: Managed Care, Other (non HMO) | Attending: Family Medicine | Admitting: Family Medicine

## 2021-07-10 ENCOUNTER — Ambulatory Visit: Payer: Managed Care, Other (non HMO) | Admitting: Family Medicine

## 2021-07-10 ENCOUNTER — Encounter: Payer: Self-pay | Admitting: Family Medicine

## 2021-07-10 VITALS — BP 110/90 | HR 71 | Temp 98.2°F | Resp 16 | Ht 73.0 in | Wt 207.7 lb

## 2021-07-10 DIAGNOSIS — R051 Acute cough: Secondary | ICD-10-CM | POA: Insufficient documentation

## 2021-07-10 NOTE — Progress Notes (Signed)
?  ?I,Jana Robinson,acting as a scribe for Lelon Huh, MD.,have documented all relevant documentation on the behalf of Lelon Huh, MD,as directed by  Lelon Huh, MD while in the presence of Lelon Huh, MD. ? ? ?Established patient visit ? ? ?Patient: James Moore   DOB: 1950/05/29   71 y.o. Male  MRN: [id number] ?Visit Date: 07/10/2021 ? ?Today's healthcare provider: Lelon Huh, MD  ? ?Chief Complaint  ?Patient presents with  ? Cough  ? ?Subjective  ?  ?Cough ?This is a new (10-15 days per pt) problem. The current episode started 1 to 4 weeks ago. The problem has been gradually worsening. The problem occurs every few minutes. The cough is Productive of sputum ("going from green to cream to clear). Associated symptoms include ear congestion, nasal congestion, shortness of breath, weight loss and wheezing. Pertinent negatives include no chest pain, chills, ear pain, fever, headaches, heartburn, hemoptysis, myalgias, postnasal drip, rash, rhinorrhea, sore throat or sweats. Associated symptoms comments: Tightness in chest and lungsl  ?Left ear feels stopped up. The symptoms are aggravated by cold air. He has tried prescription cough suppressant (mucinex tablet with no relief) for the symptoms. The treatment provided no relief. There is no history of asthma, bronchiectasis, bronchitis, COPD, emphysema, environmental allergies or pneumonia.   ? ?Was productive of green mucous and phlegm that has to changed to cream colored thick white and clear mucous. Was seen on 3/14 and prescribed Zpack and hydrocodone cough syrup. Cough has not improved although mucous is no longer dark and green.  ? ? ?Medications: ?Outpatient Medications Prior to Visit  ?Medication Sig  ? Multiple Vitamin (MULTIVITAMIN WITH MINERALS) TABS tablet Take 2 tablets by mouth daily.  ? tamsulosin (FLOMAX) 0.4 MG CAPS capsule Take 1 capsule (0.4 mg total) by mouth daily.  ? ?No facility-administered medications prior to visit.  ? ? ?Review  of Systems  ?Constitutional:  Positive for weight loss. Negative for chills and fever.  ?HENT:  Negative for ear pain, postnasal drip, rhinorrhea and sore throat.   ?Respiratory:  Positive for cough, shortness of breath and wheezing. Negative for hemoptysis.   ?Cardiovascular:  Negative for chest pain.  ?Gastrointestinal:  Negative for heartburn.  ?Musculoskeletal:  Negative for myalgias.  ?Skin:  Negative for rash.  ?Allergic/Immunologic: Negative for environmental allergies.  ?Neurological:  Negative for headaches.  ? ? ?  Objective  ?  ?BP 110/90 (BP Location: Right Arm, Patient Position: Sitting, Cuff Size: Large)   Pulse 71   Temp 98.2 ?F (36.8 ?C) (Oral)   Resp 16   Ht '6\' 1"'$  (1.854 m)   Wt 207 lb 11.2 oz (94.2 kg)   SpO2 99%   BMI 27.40 kg/m?  ? ? ?Physical Exam  ? ? ?General: Appearance:     ?Overweight male in no acute distress  ?Eyes:    PERRL, conjunctiva/corneas clear, EOM's intact       ?Lungs:     Occasional expiratory wheezes, no rales or rhonchi, respirations unlabored  ?Heart:    Normal heart rate. Normal rhythm. No murmurs, rubs, or gallops.    ?MS:   All extremities are intact.    ?Neurologic:   Awake, alert, oriented x 3. No apparent focal neurological defect.   ?   ?  ? Assessment & Plan  ?  ? ?1. Acute cough ?His description of mucous is c/w clearing infection, but cough is no better.  ? ?- DG Chest 2 View; Future  ? ?Consider steroid taper after  reviewing xr report.  ?   ? ?The entirety of the information documented in the History of Present Illness, Review of Systems and Physical Exam were personally obtained by me. Portions of this information were initially documented by the CMA and reviewed by me for thoroughness and accuracy.   ? ? ?Lelon Huh, MD  ?[facility] ?[phone] (phone) ?[phone] (fax) ? ?[facility] ?

## 2021-07-11 ENCOUNTER — Telehealth: Payer: Self-pay | Admitting: Family Medicine

## 2021-07-11 MED ORDER — PREDNISONE 10 MG PO TABS: ORAL_TABLET | ORAL | 0 refills | Status: AC

## 2021-07-11 NOTE — Telephone Encounter (Signed)
Pt had an 11:30 appt with Dr Caryn Section yesterday and was told by Dr F that he would be calling in a steroid for him. It is still open encounter with no scripts sent. Please fu. Pharmacy @ [id number] [place] [facility] [address]  [id number] ?Phone: [phone]: [phone] ? ?

## 2021-07-11 NOTE — Telephone Encounter (Signed)
Patient advised.

## 2021-07-11 NOTE — Telephone Encounter (Signed)
Don't see any bronchitis or pneumonia on xray. Still waiting for radiologist reading. Prescription [facility]one [facility] to [facility] [place].  ?

## 2021-08-01 ENCOUNTER — Ambulatory Visit: Payer: Managed Care, Other (non HMO) | Admitting: Physician Assistant

## 2021-08-01 NOTE — Progress Notes (Deleted)
I,Sha'taria Cachet Mccutchen,acting as a Education administrator for [employer], PA-C.,have documented all relevant documentation on the behalf of [name], PA-C,as directed by  [name], PA-C while in the presence of [name], PA-C.   Complete physical exam   Patient: James Moore   DOB: 1951-04-08   71 y.o. Male  MRN: [id number] Visit Date: 08/01/2021  Today's healthcare provider: [name], PA-C   No chief complaint on file.  Subjective    James Moore is a 71 y.o. male who presents today for a complete physical exam.  He reports consuming a {diet types:17450} diet. {Exercise:19826} He generally feels {well/fairly well/poorly:18703}. He reports sleeping {well/fairly well/poorly:18703}. He {does/does not:200015} have additional problems to discuss today.  HPI  ***  Past Medical History:  Diagnosis Date   Basal cell carcinoma 09/07/2008   R sideburn within hairline    Colon polyps    COVID-19 02/2019   Dysplastic nevus 03/28/2010   L lat thigh near knee - mild    Hemorrhoid    prn otc hem.cream   History of MRSA infection    Sleep apnea    lost weight so dc'd cpap   Past Surgical History:  Procedure Laterality Date   CATARACT EXTRACTION Left 03/2019   COLONOSCOPY  2007   KNEE SURGERY Left    2005-2006- meniscus repair   POLYPECTOMY     UMBILICAL HERNIA REPAIR N/A 03/19/2019   Procedure: HERNIA REPAIR UMBILICAL ADULT;  Surgeon: Olean Ree, MD;  Location: [facility] ORS;  Service: General;  Laterality: N/A;   Social History   Socioeconomic History   Marital status: Married    Spouse name: Not on file   Number of children: 0   Years of education: Not on file   Highest education level: Not on file  Occupational History   Occupation: Sales  Tobacco Use   Smoking status: Never   Smokeless tobacco: Never  Vaping Use   Vaping Use: Never used  Substance and Sexual Activity   Alcohol use: Not Currently    Alcohol/week: 0.0 standard drinks   Drug use: No    Sexual activity: Not on file  Other Topics Concern   Not on file  Social History Narrative   Not on file   Social Determinants of Health   Financial Resource Strain: Not on file  Food Insecurity: Not on file  Transportation Needs: Not on file  Physical Activity: Not on file  Stress: Not on file  Social Connections: Not on file  Intimate Partner Violence: Not on file   Family Status  Relation Name Status   Mother  Deceased   Father  Deceased   Neg Hx  (Not Specified)   Family History  Problem Relation Age of Onset   Crohn's disease Mother    Colon polyps Mother    Dementia Father    Colon cancer Neg Hx    Rectal cancer Neg Hx    Stomach cancer Neg Hx    No Known Allergies  Patient Care Team: Birdie Sons, MD as PCP - General (Family Medicine)   Medications: Outpatient Medications Prior to Visit  Medication Sig   Multiple Vitamin (MULTIVITAMIN WITH MINERALS) TABS tablet Take 2 tablets by mouth daily.   tamsulosin (FLOMAX) 0.4 MG CAPS capsule Take 1 capsule (0.4 mg total) by mouth daily.   No facility-administered medications prior to visit.    Review of Systems  {Labs  Heme  Chem  Endocrine  Serology  Results Review (optional):23779}  Objective    There were no vitals taken for this visit. {Show previous vital signs (optional):23777}   Physical Exam  ***  Last depression screening scores    06/27/2021   10:48 AM 02/08/2020    4:00 PM 04/01/2019   10:08 AM  PHQ 2/9 Scores  PHQ - 2 Score 0 0 0  PHQ- 9 Score 2  0   Last fall risk screening    07/31/2021   12:32 PM  Fall Risk   Falls in the past year? 0   Last Audit-C alcohol use screening    07/31/2021   12:32 PM  Alcohol Use Disorder Test (AUDIT)  1. How often do you have a drink containing alcohol? 0  3. How often do you have six or more drinks on one occasion? 0   A score of 3 or more in women, and 4 or more in men indicates increased risk for alcohol abuse, EXCEPT if all of the  points are from question 1   No results found for any visits on 08/01/21.  Assessment & Plan    Routine Health Maintenance and Physical Exam  Exercise Activities and Dietary recommendations  Goals   None     Immunization History  Administered Date(s) Administered   Fluad Quad(high Dose 65+) 04/01/2019   Moderna Sars-Covid-2 Vaccination 06/15/2019, 07/10/2019   Tdap 06/17/2015, 07/18/2019    Health Maintenance  Topic Date Due   Hepatitis C Screening  Never done   Zoster Vaccines- Shingrix (1 of 2) Never done   Pneumonia Vaccine 35+ Years old (1 - PCV) Never done   COVID-19 Vaccine (3 - Moderna risk series) 08/07/2019   INFLUENZA VACCINE  11/14/2021   COLONOSCOPY (Pts 45-27yr Insurance coverage will need to be confirmed)  06/30/2025   TETANUS/TDAP  07/17/2029   HPV VACCINES  Aged Out    Discussed health benefits of physical activity, and encouraged him to engage in regular exercise appropriate for his age and condition.  ***  No follow-ups on file.     {provider attestation***:1}   LMikey Kirschner PA-C  [facility] [phone](phone) [phone](fax)  [place]

## 2021-08-14 NOTE — Progress Notes (Deleted)
? ? ?I,[name],acting as a Education administrator for [employer], PA-C.,have documented all relevant documentation on the behalf of James Kirschner, PA-C,as directed by  James Kirschner, PA-C while in the presence of James Kirschner, PA-C. ? ? ?Complete physical exam ? ? ?Patient: James Moore   DOB: [birth year]   71 y.o. Male  MRN: [id number] ?Visit Date: 08/15/2021 ? ?Today's healthcare provider: [name], PA-C  ? ?No chief complaint on file. ? ?Subjective  ?  ?James Moore is a 71 y.o. male who presents today for a complete physical exam.  ?He reports consuming a general and peskatarian  diet.  The patient reports going to the way at least 2 times a week participating in moderate exercise such as cardio  He generally feels well. He reports sleeping well. He does not have additional problems to discuss today.  ?HPI  ?-Wouls like PSA checxked ? ?Past Medical History:  ?Diagnosis Date  ? Basal cell carcinoma 09/07/2008  ? R sideburn within hairline   ? Colon polyps   ? COVID-19 02/2019  ? Dysplastic nevus 03/28/2010  ? L lat thigh near knee - mild   ? Hemorrhoid   ? prn otc hem.cream  ? History of MRSA infection   ? Sleep apnea   ? lost weight so dc'd cpap  ? ?Past Surgical History:  ?Procedure Laterality Date  ? CATARACT EXTRACTION Left 03/2019  ? COLONOSCOPY  2007  ? KNEE SURGERY Left   ? 2005-2006- meniscus repair  ? POLYPECTOMY    ? UMBILICAL HERNIA REPAIR N/A 03/19/2019  ? Procedure: HERNIA REPAIR UMBILICAL ADULT;  Surgeon: Olean Ree, MD;  Location: [facility] ORS;  Service: General;  Laterality: N/A;  ? ?Social History  ? ?Socioeconomic History  ? Marital status: Married  ?  Spouse name: Not on file  ? Number of children: 0  ? Years of education: Not on file  ? Highest education level: Not on file  ?Occupational History  ? Occupation: Sales  ?Tobacco Use  ? Smoking status: Never  ? Smokeless tobacco: Never  ?Vaping Use  ? Vaping Use: Never used  ?Substance and Sexual Activity  ? Alcohol use: Not Currently  ?   Alcohol/week: 0.0 standard drinks  ? Drug use: No  ? Sexual activity: Not on file  ?Other Topics Concern  ? Not on file  ?Social History Narrative  ? Not on file  ? ?Social Determinants of Health  ? ?Financial Resource Strain: Not on file  ?Food Insecurity: Not on file  ?Transportation Needs: Not on file  ?Physical Activity: Not on file  ?Stress: Not on file  ?Social Connections: Not on file  ?Intimate Partner Violence: Not on file  ? ?Family Status  ?Relation Name Status  ? Mother  Deceased  ? Father  Deceased  ? Neg Hx  (Not Specified)  ? ?Family History  ?Problem Relation Age of Onset  ? Crohn's disease Mother   ? Colon polyps Mother   ? Dementia Father   ? Colon cancer Neg Hx   ? Rectal cancer Neg Hx   ? Stomach cancer Neg Hx   ? ?No Known Allergies  ?Patient Care Team: ?Birdie Sons, MD as PCP - General (Family Medicine)  ? ?Medications: ?Outpatient Medications Prior to Visit  ?Medication Sig  ? Multiple Vitamin (MULTIVITAMIN WITH MINERALS) TABS tablet Take 2 tablets by mouth daily.  ? tamsulosin (FLOMAX) 0.4 MG CAPS capsule Take 1 capsule (0.4 mg total) by mouth daily.  ? ?No facility-administered  medications prior to visit.  ? ? ?Review of Systems  ?HENT:  Positive for tinnitus.   ?Eyes:  Positive for photophobia.  ?Genitourinary:  Positive for difficulty urinating.  ?Musculoskeletal:  Positive for arthralgias.  ? ?{Labs  Heme  Chem  Endocrine  Serology  Results Review (optional):23779} ? Objective  ?  ?There were no vitals taken for this visit. ?{Show previous vital signs (optional):23777} ? ? ?Physical Exam  ?*** ? ?Last depression screening scores ? ?  06/27/2021  ? 10:48 AM 02/08/2020  ?  4:00 PM 04/01/2019  ? 10:08 AM  ?PHQ 2/9 Scores  ?PHQ - 2 Score 0 0 0  ?PHQ- 9 Score 2  0  ? ?Last fall risk screening ? ?  07/31/2021  ? 12:32 PM  ?Fall Risk   ?Falls in the past year? 0  ? ?Last Audit-C alcohol use screening ? ?  07/31/2021  ? 12:32 PM  ?Alcohol Use Disorder Test (AUDIT)  ?1. How often do you  have a drink containing alcohol? 0  ?3. How often do you have six or more drinks on one occasion? 0  ? ?A score of 3 or more in women, and 4 or more in men indicates increased risk for alcohol abuse, EXCEPT if all of the points are from question 1  ? ?No results found for any visits on 08/15/21. ? Assessment & Plan  ?  ?Routine Health Maintenance and Physical Exam ? ?Exercise Activities and Dietary recommendations ? Goals   ?None ?  ? ? ?Immunization History  ?Administered Date(s) Administered  ? Fluad Quad(high Dose 65+) 04/01/2019  ? Moderna Sars-Covid-2 Vaccination 06/15/2019, 07/10/2019  ? Tdap 06/17/2015, 07/18/2019  ? ? ?Health Maintenance  ?Topic Date Due  ? Hepatitis C Screening  Never done  ? Zoster Vaccines- Shingrix (1 of 2) Never done  ? Pneumonia Vaccine 52+ Years old (1 - PCV) Never done  ? COVID-19 Vaccine (3 - Moderna risk series) 08/07/2019  ? INFLUENZA VACCINE  11/14/2021  ? COLONOSCOPY (Pts 45-30yr Insurance coverage will need to be confirmed)  06/30/2025  ? TETANUS/TDAP  07/17/2029  ? HPV VACCINES  Aged Out  ? ? ?Discussed health benefits of physical activity, and encouraged him to engage in regular exercise appropriate for his age and condition. ? ?*** ? ?No follow-ups on file.  ?  ? ?{provider attestation***:1} ? ? ?LMikey Kirschner PA-C  ?[place]?[phone](phone) ?[phone](fax) ? ?[facility] ? ?

## 2021-08-15 ENCOUNTER — Ambulatory Visit (INDEPENDENT_AMBULATORY_CARE_PROVIDER_SITE_OTHER): Payer: Managed Care, Other (non HMO) | Admitting: Physician Assistant

## 2021-08-15 ENCOUNTER — Encounter: Payer: Self-pay | Admitting: Physician Assistant

## 2021-08-15 VITALS — BP 136/98 | HR 64 | Ht 73.0 in | Wt 204.1 lb

## 2021-08-15 DIAGNOSIS — N529 Male erectile dysfunction, unspecified: Secondary | ICD-10-CM

## 2021-08-15 DIAGNOSIS — E782 Mixed hyperlipidemia: Secondary | ICD-10-CM

## 2021-08-15 DIAGNOSIS — R39198 Other difficulties with micturition: Secondary | ICD-10-CM

## 2021-08-15 DIAGNOSIS — Z0000 Encounter for general adult medical examination without abnormal findings: Secondary | ICD-10-CM | POA: Diagnosis not present

## 2021-08-15 DIAGNOSIS — Z6826 Body mass index (BMI) 26.0-26.9, adult: Secondary | ICD-10-CM

## 2021-08-15 DIAGNOSIS — Z1159 Encounter for screening for other viral diseases: Secondary | ICD-10-CM

## 2021-08-15 MED ORDER — TAMSULOSIN HCL 0.4 MG PO CAPS: 0.4000 mg | ORAL_CAPSULE | Freq: Every day | ORAL | 0 refills | Status: AC

## 2021-08-15 NOTE — Progress Notes (Signed)
? ? ?I,Sha'taria Tyson,acting as a Education administrator for [employer], PA-C.,have documented all relevant documentation on the behalf of James Kirschner, PA-C,as directed by  James Kirschner, PA-C while in the presence of James Kirschner, PA-C. ? ? ?Complete physical exam ? ? ?Patient: James Moore   DOB: July 27, 1950   71 y.o. Male  MRN: [id number] ?Visit Date: 08/15/2021 ? ?Today's healthcare provider: [name], PA-C  ? ?Cc. cpe ? ?Subjective  ?  ?James Moore is a 71 y.o. male who presents today for a complete physical exam.  ?He reports consuming a general and pescatarian  diet.  The patient reports going to the way at least 2 times a week participating in moderate exercise such as cardio  He generally feels well. He reports sleeping well. He does have additional problems to discuss today.  ?HPI  ?-Wouls like PSA checked, issues with urination, nocturia, ED ? ?Past Medical History:  ?Diagnosis Date  ? Basal cell carcinoma 09/07/2008  ? R sideburn within hairline   ? Colon polyps   ? COVID-19 02/2019  ? Dysplastic nevus 03/28/2010  ? L lat thigh near knee - mild   ? Hemorrhoid   ? prn otc hem.cream  ? History of MRSA infection   ? Sleep apnea   ? lost weight so dc'd cpap  ? ?Past Surgical History:  ?Procedure Laterality Date  ? CATARACT EXTRACTION Left 03/2019  ? COLONOSCOPY  2007  ? KNEE SURGERY Left   ? 2005-2006- meniscus repair  ? POLYPECTOMY    ? UMBILICAL HERNIA REPAIR N/A 03/19/2019  ? Procedure: HERNIA REPAIR UMBILICAL ADULT;  Surgeon: Olean Ree, MD;  Location: [facility] ORS;  Service: General;  Laterality: N/A;  ? ?Social History  ? ?Socioeconomic History  ? Marital status: Married  ?  Spouse name: Not on file  ? Number of children: 0  ? Years of education: Not on file  ? Highest education level: Not on file  ?Occupational History  ? Occupation: Sales  ?Tobacco Use  ? Smoking status: Never  ? Smokeless tobacco: Never  ?Vaping Use  ? Vaping Use: Never used  ?Substance and Sexual Activity  ? Alcohol use: Not  Currently  ?  Alcohol/week: 0.0 standard drinks  ? Drug use: No  ? Sexual activity: Not on file  ?Other Topics Concern  ? Not on file  ?Social History Narrative  ? Not on file  ? ?Social Determinants of Health  ? ?Financial Resource Strain: Not on file  ?Food Insecurity: Not on file  ?Transportation Needs: Not on file  ?Physical Activity: Not on file  ?Stress: Not on file  ?Social Connections: Not on file  ?Intimate Partner Violence: Not on file  ? ?Family Status  ?Relation Name Status  ? Mother  Deceased  ? Father  Deceased  ? Neg Hx  (Not Specified)  ? ?Family History  ?Problem Relation Age of Onset  ? Crohn's disease Mother   ? Colon polyps Mother   ? Dementia Father   ? Colon cancer Neg Hx   ? Rectal cancer Neg Hx   ? Stomach cancer Neg Hx   ? ?No Known Allergies  ?Patient Care Team: ?Birdie Sons, MD as PCP - General (Family Medicine)  ? ?Medications: ?Outpatient Medications Prior to Visit  ?Medication Sig  ? Multiple Vitamin (MULTIVITAMIN WITH MINERALS) TABS tablet Take 2 tablets by mouth daily.  ? [DISCONTINUED] tamsulosin (FLOMAX) 0.4 MG CAPS capsule Take 1 capsule (0.4 mg total) by mouth daily. (Patient not  taking: Reported on 08/15/2021)  ? ?No facility-administered medications prior to visit.  ? ? ?Review of Systems  ?HENT:  Positive for tinnitus.   ?Eyes:  Positive for photophobia.  ?Genitourinary:  Positive for difficulty urinating and frequency.  ?Musculoskeletal:  Positive for arthralgias.  ? ? Objective  ? ?  ?Blood pressure (!) 136/98, pulse 64, height '6\' 1"'$  (1.854 m), weight 204 lb 1.6 oz (92.6 kg), SpO2 98 %.  ? ? ?Physical Exam ?Constitutional:   ?   General: He is awake.  ?   Appearance: He is well-developed.  ?HENT:  ?   Head: Normocephalic.  ?   Right Ear: Tympanic membrane, ear canal and external ear normal.  ?   Left Ear: Tympanic membrane, ear canal and external ear normal.  ?   Nose: Nose normal. No congestion or rhinorrhea.  ?   Mouth/Throat:  ?   Mouth: Mucous membranes are moist.  ?    Pharynx: No oropharyngeal exudate or posterior oropharyngeal erythema.  ?Eyes:  ?   Pupils: Pupils are equal, round, and reactive to light.  ?Cardiovascular:  ?   Rate and Rhythm: Normal rate and regular rhythm.  ?   Heart sounds: Normal heart sounds.  ?Pulmonary:  ?   Effort: Pulmonary effort is normal.  ?   Breath sounds: Normal breath sounds.  ?Abdominal:  ?   General: There is no distension.  ?   Palpations: Abdomen is soft.  ?   Tenderness: There is no abdominal tenderness. There is no guarding.  ?Musculoskeletal:  ?   Cervical back: Normal range of motion.  ?   Right lower leg: No edema.  ?   Left lower leg: No edema.  ?Lymphadenopathy:  ?   Cervical: No cervical adenopathy.  ?Skin: ?   General: Skin is warm.  ?Neurological:  ?   Mental Status: He is alert and oriented to person, place, and time.  ?Psychiatric:     ?   Attention and Perception: Attention normal.     ?   Mood and Affect: Mood normal.     ?   Speech: Speech normal.     ?   Behavior: Behavior normal. Behavior is cooperative.  ?  ? ?Last depression screening scores ? ?  08/15/2021  ?  9:57 AM 06/27/2021  ? 10:48 AM 02/08/2020  ?  4:00 PM  ?PHQ 2/9 Scores  ?PHQ - 2 Score 0 0 0  ?PHQ- 9 Score 0 2   ? ?Last fall risk screening ? ?  08/15/2021  ?  9:57 AM  ?Fall Risk   ?Falls in the past year? 0  ?Number falls in past yr: 0  ?Injury with Fall? 0  ?Risk for fall due to : No Fall Risks  ? ?Last Audit-C alcohol use screening ? ?  07/31/2021  ? 12:32 PM  ?Alcohol Use Disorder Test (AUDIT)  ?1. How often do you have a drink containing alcohol? 0  ?3. How often do you have six or more drinks on one occasion? 0  ? ?A score of 3 or more in women, and 4 or more in men indicates increased risk for alcohol abuse, EXCEPT if all of the points are from question 1  ? ?No results found for any visits on 08/15/21. ? Assessment & Plan  ?  ?Routine Health Maintenance and Physical Exam ? ?Exercise Activities and Dietary recommendations ?--balanced diet high in fiber and  protein, low in sugars, carbs, fats. ?--physical activity/exercise 30 minutes  3-5 times a week   ? ?Immunization History  ?Administered Date(s) Administered  ? Fluad Quad(high Dose 65+) 04/01/2019  ? Moderna Sars-Covid-2 Vaccination 06/15/2019, 07/10/2019  ? Tdap 06/17/2015, 07/18/2019  ? ? ?Health Maintenance  ?Topic Date Due  ? Hepatitis C Screening  Never done  ? COVID-19 Vaccine (3 - Moderna risk series) 08/31/2021 (Originally 08/07/2019)  ? Zoster Vaccines- Shingrix (1 of 2) 11/15/2021 (Originally 04/15/1972)  ? Pneumonia Vaccine 23+ Years old (1 - PCV) 08/16/2022 (Originally 04/15/2018)  ? INFLUENZA VACCINE  11/14/2021  ? COLONOSCOPY (Pts 45-66yr Insurance coverage will need to be confirmed)  06/30/2025  ? TETANUS/TDAP  07/17/2029  ? HPV VACCINES  Aged Out  ? ? ?Discussed health benefits of physical activity, and encouraged him to engage in regular exercise appropriate for his age and condition. ? ? ?Problem List Items Addressed This Visit   ? ?  ? Other  ? Difficulty urinating  ?  Refilled flomax rx, can use prn for now ?Will recheck psa, historically has been elevated ?Ref to urology for ongoing urination difficulty and ED issues ? ?  ?  ? Relevant Medications  ? tamsulosin (FLOMAX) 0.4 MG CAPS capsule  ? Other Relevant Orders  ? TSH + free T4  ? PSA  ? Ambulatory referral to Urology  ? ?Other Visit Diagnoses   ? ? Encounter for annual physical exam    -  Primary  ? Relevant Orders  ? Comprehensive Metabolic Panel (CMET)  ? CBC w/Diff/Platelet  ? Moderate mixed hyperlipidemia not requiring statin therapy      ? Relevant Orders  ? Lipid Profile  ? BMI 26.0-26.9,adult      ? Relevant Orders  ? Lipid Profile  ? HgB A1c  ? Encounter for hepatitis C screening test for low risk patient      ? Relevant Orders  ? Hepatitis C antibody  ? Erectile dysfunction, unspecified erectile dysfunction type      ? Relevant Orders  ? Ambulatory referral to Urology  ? ?  ?  ?Return in about 1 year (around 08/16/2022) for CPE.  ?   ? ?I, LMikey Kirschner PA-C have reviewed all documentation for this visit. The documentation on  08/15/2021 for the exam, diagnosis, procedures, and orders are all accurate and complete. ? ?LMikey Kirschner PA-C ?B

## 2021-08-15 NOTE — Assessment & Plan Note (Signed)
Refilled flomax rx, can use prn for now ?Will recheck psa, historically has been elevated ?Ref to urology for ongoing urination difficulty and ED issues ?

## 2021-08-16 LAB — CBC WITH DIFFERENTIAL/PLATELET
Basophils Absolute: 0 10*3/uL (ref 0.0–0.2)
Basos: 1 %
EOS (ABSOLUTE): 0.1 10*3/uL (ref 0.0–0.4)
Eos: 1 %
Hematocrit: 46.3 % (ref 37.5–51.0)
Hemoglobin: 15.9 g/dL (ref 13.0–17.7)
Immature Grans (Abs): 0 10*3/uL (ref 0.0–0.1)
Immature Granulocytes: 0 %
Lymphocytes Absolute: 1.8 10*3/uL (ref 0.7–3.1)
Lymphs: 32 %
MCH: 29.4 pg (ref 26.6–33.0)
MCHC: 34.3 g/dL (ref 31.5–35.7)
MCV: 86 fL (ref 79–97)
Monocytes Absolute: 0.5 10*3/uL (ref 0.1–0.9)
Monocytes: 9 %
Neutrophils Absolute: 3.2 10*3/uL (ref 1.4–7.0)
Neutrophils: 57 %
Platelets: 237 10*3/uL (ref 150–450)
RBC: 5.41 x10E6/uL (ref 4.14–5.80)
RDW: 12.1 % (ref 11.6–15.4)
WBC: 5.7 10*3/uL (ref 3.4–10.8)

## 2021-08-16 LAB — COMPREHENSIVE METABOLIC PANEL
ALT: 20 IU/L (ref 0–44)
AST: 25 IU/L (ref 0–40)
Albumin/Globulin Ratio: 1.8 (ref 1.2–2.2)
Albumin: 4.6 g/dL (ref 3.8–4.8)
Alkaline Phosphatase: 60 IU/L (ref 44–121)
BUN/Creatinine Ratio: 15 (ref 10–24)
BUN: 17 mg/dL (ref 8–27)
Bilirubin Total: 0.6 mg/dL (ref 0.0–1.2)
CO2: 25 mmol/L (ref 20–29)
Calcium: 9.8 mg/dL (ref 8.6–10.2)
Chloride: 102 mmol/L (ref 96–106)
Creatinine, Ser: 1.1 mg/dL (ref 0.76–1.27)
Globulin, Total: 2.5 g/dL (ref 1.5–4.5)
Glucose: 101 mg/dL — ABNORMAL HIGH (ref 70–99)
Potassium: 5.2 mmol/L (ref 3.5–5.2)
Sodium: 140 mmol/L (ref 134–144)
Total Protein: 7.1 g/dL (ref 6.0–8.5)
eGFR: 73 mL/min/{1.73_m2} (ref >59)

## 2021-08-16 LAB — HEMOGLOBIN A1C
Est. average glucose Bld gHb Est-mCnc: 114 mg/dL
Hgb A1c MFr Bld: 5.6 % (ref 4.8–5.6)

## 2021-08-16 LAB — HEPATITIS C ANTIBODY: Hep C Virus Ab: NONREACTIVE

## 2021-08-16 LAB — LIPID PANEL
Chol/HDL Ratio: 3.2 ratio (ref 0.0–5.0)
Cholesterol, Total: 183 mg/dL (ref 100–199)
HDL: 57 mg/dL (ref >39)
LDL Chol Calc (NIH): 111 mg/dL — ABNORMAL HIGH (ref 0–99)
Triglycerides: 81 mg/dL (ref 0–149)
VLDL Cholesterol Cal: 15 mg/dL (ref 5–40)

## 2021-08-16 LAB — PSA: Prostate Specific Ag, Serum: 3.5 ng/mL (ref 0.0–4.0)

## 2021-08-16 LAB — TSH+FREE T4
Free T4: 1.23 ng/dL (ref 0.82–1.77)
TSH: 2.79 u[IU]/mL (ref 0.450–4.500)

## 2021-09-01 ENCOUNTER — Encounter: Payer: Self-pay | Admitting: Urology

## 2021-09-01 ENCOUNTER — Ambulatory Visit: Payer: Managed Care, Other (non HMO) | Admitting: Urology

## 2021-09-01 VITALS — BP 127/68 | HR 65 | Ht 73.0 in | Wt 206.0 lb

## 2021-09-01 DIAGNOSIS — N401 Enlarged prostate with lower urinary tract symptoms: Secondary | ICD-10-CM | POA: Diagnosis not present

## 2021-09-01 DIAGNOSIS — N529 Male erectile dysfunction, unspecified: Secondary | ICD-10-CM | POA: Diagnosis not present

## 2021-09-01 DIAGNOSIS — R39198 Other difficulties with micturition: Secondary | ICD-10-CM

## 2021-09-01 MED ORDER — SILDENAFIL CITRATE 20 MG PO TABS: 20.0000 mg | ORAL_TABLET | ORAL | 2 refills | Status: DC | PRN

## 2021-09-01 NOTE — Progress Notes (Signed)
09/01/21 11:06 AM   James Moore May 22, 1950 [id number]  Referring provider:  [name], PA-C [address] [place],  [name] [address] Chief Complaint  Patient presents with   Erectile Dysfunction    HPI: James Moore is a 71 y.o.male with a personal history for elevated PSA and BPH with urinary obstruction, who presents today for further evaluation of ED.  He was last seen in clinic on 05/13/2021. PSA at time was elevated to 4.7 . He had significant obstructive urinary symptoms but was minimally bothered.  His most recent PSA on was 3.5 on 08/15/2021. PSA trend below.   He was seen on 08/15/2021 by his PCP, James Kirschner, PA-C. He was noted to have issues with his ED and he wanted to get his PSA checked. He was referred to urology   He reports that on Flomax he still had urinary frequency during the daytime and nighttime voids. He intermittently has weak stream and he sometimes has to strain to start urination.   He has never been on PDE5 inhibitors he does not have any underlying cardiac issues.    PSA trend:  Prostate Specific Ag, Serum  Latest Ref Rng 0.0 - 4.0 ng/mL  04/01/2019 4.7 (H)   05/14/2019 3.1   08/15/2021 3.5     (H) High   IPSS     Row Name 09/01/21 0900         International Prostate Symptom Score   How often have you had the sensation of not emptying your bladder? More than half the time     How often have you had to urinate less than every two hours? Less than half the time     How often have you found you stopped and started again several times when you urinated? More than half the time     How often have you found it difficult to postpone urination? More than half the time     How often have you had a weak urinary stream? More than half the time     How often have you had to strain to start urination? More than half the time     How many times did you typically get up at night to urinate? 1 Time     Total IPSS Score 23        Quality of Life due to urinary symptoms   If you were to spend the rest of your life with your urinary condition just the way it is now how would you feel about that? Mixed              Score:  1-7 Mild 8-19 Moderate 20-35 Severe   SHIM     Row Name 09/01/21 0927         SHIM: Over the last 6 months:   How do you rate your confidence that you could get and keep an erection? Low     When you had erections with sexual stimulation, how often were your erections hard enough for penetration (entering your partner)? Sometimes (about half the time)     During sexual intercourse, how often were you able to maintain your erection after you had penetrated (entered) your partner? Sometimes (about half the time)     During sexual intercourse, how difficult was it to maintain your erection to completion of intercourse? Difficult     When you attempted sexual intercourse, how often was it satisfactory for you? Sometimes (about half the time)  SHIM Total Score   SHIM 14               PMH: Past Medical History:  Diagnosis Date   Basal cell carcinoma 09/07/2008   R sideburn within hairline    Colon polyps    COVID-19 02/2019   Dysplastic nevus 03/28/2010   L lat thigh near knee - mild    Hemorrhoid    prn otc hem.cream   History of MRSA infection    Sleep apnea    lost weight so dc'd cpap    Surgical History: Past Surgical History:  Procedure Laterality Date   CATARACT EXTRACTION Left 03/2019   COLONOSCOPY  2007   KNEE SURGERY Left    2005-2006- meniscus repair   POLYPECTOMY     UMBILICAL HERNIA REPAIR N/A 03/19/2019   Procedure: HERNIA REPAIR UMBILICAL ADULT;  Surgeon: Olean Ree, MD;  Location: [facility] ORS;  Service: General;  Laterality: N/A;    Home Medications:  Allergies as of 09/01/2021   No Known Allergies      Medication List        Accurate as of Sep 01, 2021 11:06 AM. If you have any questions, ask your nurse or doctor.           multivitamin with minerals Tabs tablet Take 2 tablets by mouth daily.   sildenafil 20 MG tablet Commonly known as: REVATIO Take 1 tablet (20 mg total) by mouth as needed. Take 1-5 tablets one hour prior to intercourse Started by: Hollice Espy, MD   tamsulosin 0.4 MG Caps capsule Commonly known as: Flomax Take 1 capsule (0.4 mg total) by mouth daily.        Allergies:  No Known Allergies  Family History: Family History  Problem Relation Age of Onset   Crohn's disease Mother    Colon polyps Mother    Dementia Father    Colon cancer Neg Hx    Rectal cancer Neg Hx    Stomach cancer Neg Hx     Social History:  reports that he has never smoked. He has never used smokeless tobacco. He reports that he does not currently use alcohol. He reports that he does not use drugs.   Physical Exam: BP 127/68   Pulse 65   Ht '6\' 1"'$  (1.854 m)   Wt 206 lb (93.4 kg)   BMI 27.18 kg/m   Constitutional:  Alert and oriented, No acute distress. HEENT: [place] AT, moist mucus membranes.  Trachea midline, no masses. Cardiovascular: No clubbing, cyanosis, or edema. Respiratory: Normal respiratory effort, no increased work of breathing. Rectal: Normal sphincter tone,  60+  CC prostate, smooth no nodules Skin: No rashes, bruises or suspicious lesions. Neurologic: Grossly intact, no focal deficits, moving all 4 extremities. Psychiatric: Normal mood and affect.  Laboratory Data: Lab Results  Component Value Date   CREATININE 1.10 08/15/2021   Lab Results  Component Value Date   HGBA1C 5.6 08/15/2021     Assessment & Plan:    BPH with LUTS - Continue Flomax - Rectal exam benign  -We discussed medical optimization versus procedural intervention.  He is more interested in procedural intervention and prefers to avoid additional medications.   -We discussed need for anatomic evaluation with cystoscopy TRUS to which he is agreeable  2. Erectile dysfunction  - We discussed the  pathophysiology of erectile dysfunction today along with possible contributing factors. Discussed possible treatment options including PDE 5 inhibitors, vacuum erectile device, intracavernosal injection, MUSE, and placement of the  inflatable or malleable penile prosthesis for refractory cases.  In terms of PDE 5 inhibitors, we discussed contraindications for this medication as well as common side effects. Patient was counseled on optimal use. All of his questions were answered in detail. - Prescribed sildenafil 20 mg up to 100 mg as needed, discussed optimization - Considering cardiac evaluation   F/u cystoscopy TRUS  I,Kailey Littlejohn,acting as a scribe for Hollice Espy, MD.,have documented all relevant documentation on the behalf of Hollice Espy, MD,as directed by  Hollice Espy, MD while in the presence of Hollice Espy, MD.  I have reviewed the above documentation for accuracy and completeness, and I agree with the above.   Hollice Espy, MD  [facility] [address] [place], [place] [address] [phone]

## 2021-09-01 NOTE — Patient Instructions (Signed)
Transrectal Ultrasound  A transrectal ultrasound is a procedure that uses sound waves to create images of the prostate gland and nearby tissues. For this procedure, an ultrasound probe is placed in the rectum. The probe sends sound waves through the wall of the rectum into the prostate gland. The prostate is a walnut-sized gland that is located below the bladder and in front of the rectum. The images show the size and shape of the prostate gland and nearby structures. You may need this test if you have: Trouble urinating. Trouble getting your partner pregnant (infertility). An abnormal result from a prostate screening exam. Tell a health care provider about: Any allergies you have. All medicines you are taking, including vitamins, herbs, eye drops, creams, and over-the-counter medicines. Any bleeding problems you have. Any surgeries you have had. Any medical conditions you have. Any prostate infections you have had. What are the risks? Generally, this is a safe procedure. However, problems may occur, including: Discomfort during the procedure. Blood in your urine or sperm after the procedure. This may occur if a sample of tissue is taken to look at under a microscope (biopsy) during the procedure. What happens before the procedure? Your health care provider may instruct you to use an enema 1-4 hours before the procedure. Follow instructions from your health care provider about how to do the enema. Ask your health care provider about: Changing or stopping your regular medicines. This is especially important if you are taking diabetes medicines or blood thinners. Taking medicines such as aspirin and ibuprofen. These medicines can thin your blood. Do not take these medicines unless your health care provider tells you to take them. Taking over-the-counter medicines, vitamins, herbs, and supplements. What happens during the procedure? You will be asked to lie down on your left side on an exam  table. You will bend your knees toward your chest. Gel will be put on a small probe that is about the width of a finger. The probe will be gently inserted into your rectum. You may have a feeling of fullness but should not feel pain. The probe will send signals to a computer that will create images. These will be displayed on a monitor that looks like a small television screen. The technician will slightly rotate the probe throughout the procedure. While rotating the probe, he or she will view and capture images of the prostate gland and the surrounding structures from different angles. Your health care provider may take a biopsy sample of prostate tissue during the procedure. The images captured from the ultrasound will help guide the needle that is used to remove a sample of tissue. The sample will be sent to a lab for testing. The probe will be removed. The procedure may vary among health care providers and hospitals. What can I expect after the procedure? It is up to you to get the results of your procedure. Ask your health care provider, or the department that is doing the procedure, when your results will be ready. Keep all follow-up visits. This is important. Summary A transrectal ultrasound is a procedure that uses sound waves to create images of the prostate gland and nearby tissues. The images show the size and shape of the prostate gland and nearby structures. Before the procedure, ask your health care provider about changing or stopping your regular medicines. This is especially important if you are taking diabetes medicines or blood thinners. This information is not intended to replace advice given to you by your health care   provider. Make sure you discuss any questions you have with your health care provider. Document Revised: 12/14/2020 Document Reviewed: 09/26/2020 Elsevier Patient Education  2023 Elsevier Inc.  Cystoscopy Cystoscopy is a procedure that is used to help diagnose  and sometimes treat conditions that affect the lower urinary tract. The lower urinary tract includes the bladder and the urethra. The urethra is the tube that drains urine from the bladder. Cystoscopy is done using a thin, tube-shaped instrument with a light and camera at the end (cystoscope). The cystoscope may be hard or flexible, depending on the goal of the procedure. The cystoscope is inserted through the urethra, into the bladder. Cystoscopy may be recommended if you have: Urinary tract infections that keep coming back. Blood in the urine (hematuria). An inability to control when you urinate (urinary incontinence) or an overactive bladder. Unusual cells found in a urine sample. A blockage in the urethra, such as a urinary stone. Painful urination. An abnormality in the bladder found during an intravenous pyelogram (IVP) or CT scan. What are the risks? Generally, this is a safe procedure. However, problems may occur, including: Infection. Bleeding.  What happens during the procedure?  You will be given one or more of the following: A medicine to numb the area (local anesthetic). The area around the opening of your urethra will be cleaned. The cystoscope will be passed through your urethra into your bladder. Germ-free (sterile) fluid will flow through the cystoscope to fill your bladder. The fluid will stretch your bladder so that your health care provider can clearly examine your bladder walls. Your doctor will look at the urethra and bladder. The cystoscope will be removed The procedure may vary among health care providers  What can I expect after the procedure? After the procedure, it is common to have: Some soreness or pain in your urethra. Urinary symptoms. These include: Mild pain or burning when you urinate. Pain should stop within a few minutes after you urinate. This may last for up to a few days after the procedure. A small amount of blood in your urine for several  days. Feeling like you need to urinate but producing only a small amount of urine. Follow these instructions at home: General instructions Return to your normal activities as told by your health care provider.  Drink plenty of fluids after the procedure. Keep all follow-up visits as told by your health care provider. This is important. Contact a health care provider if you: Have pain that gets worse or does not get better with medicine, especially pain when you urinate lasting longer than 72 hours after the procedure. Have trouble urinating. Get help right away if you: Have blood clots in your urine. Have a fever or chills. Are unable to urinate. Summary Cystoscopy is a procedure that is used to help diagnose and sometimes treat conditions that affect the lower urinary tract. Cystoscopy is done using a thin, tube-shaped instrument with a light and camera at the end. After the procedure, it is common to have some soreness or pain in your urethra. It is normal to have blood in your urine after the procedure.  If you were prescribed an antibiotic medicine, take it as told by your health care provider.  This information is not intended to replace advice given to you by your health care provider. Make sure you discuss any questions you have with your health care provider. Document Revised: 03/25/2018 Document Reviewed: 03/25/2018 Elsevier Patient Education  2020 Elsevier Inc.   

## 2021-09-28 ENCOUNTER — Encounter: Payer: Managed Care, Other (non HMO) | Admitting: Urology

## 2021-11-21 ENCOUNTER — Ambulatory Visit (INDEPENDENT_AMBULATORY_CARE_PROVIDER_SITE_OTHER): Payer: Managed Care, Other (non HMO) | Admitting: Urology

## 2021-11-21 ENCOUNTER — Encounter: Payer: Self-pay | Admitting: Urology

## 2021-11-21 VITALS — BP 149/74 | HR 73 | Ht 73.0 in | Wt 202.0 lb

## 2021-11-21 DIAGNOSIS — N401 Enlarged prostate with lower urinary tract symptoms: Secondary | ICD-10-CM

## 2021-11-21 DIAGNOSIS — R39198 Other difficulties with micturition: Secondary | ICD-10-CM

## 2021-11-21 DIAGNOSIS — N138 Other obstructive and reflux uropathy: Secondary | ICD-10-CM | POA: Diagnosis not present

## 2021-11-21 LAB — URINALYSIS, COMPLETE
Bilirubin, UA: NEGATIVE
Glucose, UA: NEGATIVE
Ketones, UA: NEGATIVE
Nitrite, UA: NEGATIVE
Protein,UA: NEGATIVE
RBC, UA: NEGATIVE
Specific Gravity, UA: 1.015 (ref 1.005–1.030)
Urobilinogen, Ur: 0.2 mg/dL (ref 0.2–1.0)
pH, UA: 6.5 (ref 5.0–7.5)

## 2021-11-21 LAB — MICROSCOPIC EXAMINATION

## 2021-11-21 NOTE — Patient Instructions (Signed)

## 2021-11-21 NOTE — Progress Notes (Signed)
    11/21/2021 CC:  Chief Complaint  Patient presents with   Cysto    TRUS     HPI: James Moore is a 71 y.o. male male with a personal history for elevated PSA, ED, and BPH with urinary obstruction  His most recent PSA on was 3.5 on 08/15/2021. PSA trend below.   He is managed on Flomax. He is managed on sildenafil for ED.    Vitals:   11/21/21 1404  BP: (!) 149/74  Pulse: 73   NED. A&Ox3.   No respiratory distress   Abd soft, NT, ND Normal phallus with bilateral descended testicles  Cystoscopy Procedure Note  Patient identification was confirmed, informed consent was obtained, and patient was prepped using Betadine solution.  Lidocaine jelly was administered per urethral meatus.     Pre-Procedure: - Inspection reveals a normal caliber ureteral meatus.  Procedure: The flexible cystoscope was introduced without difficulty - No urethral strictures/lesions are present. - Enlarged prostate bilobar coaptation  - Mildly Elevated bladder neck - Bilateral ureteral orifices identified - Bladder mucosa  reveals no ulcers, tumors, or lesions - No bladder stones - Mild to moderate trabeculation  Retroflexion shows intravesical kissing lateral lobes.     Post-Procedure: - Patient tolerated the procedure well   Prostate transrectal ultrasound sizing   Informed consent was obtained after discussing risks/benefits of the procedure.  A time out was performed to ensure correct patient identity.   Pre-Procedure: -Transrectal probe was placed without difficulty -Transrectal Ultrasound performed revealing a 76.75 gm prostate measuring 5.78 x 4.21 x 6.03 cm (length) -No significant hypoechoic or median lobe noted   Assessment/ Plan:  BPH with LUTS  - We discussed optimization of medication with finasteride and the side effects including sexual side effects versus and outlet procedure such as UroLift or HoLEP vs TURP we discussed these procedures in detail.  - We discussed  the risks and complications of UroLift which include but are not limited to infection, bleeding, and inadequate treatment with the Urolift procedure alone, anesthetic complications, among others.   - We discussed the common postoperative course following holep including need for overnight Foley catheter, temporary worsening of irritative voiding symptoms, and occasional stress incontinence which typically lasts up to 6 months but can persist.  We discussed retrograde ejaculation and damage to surrounding structures including the urinary sphincter. Other uncommon complications including hematuria and urinary tract infection. - He will let us know what he would like to proceed with.   Return for 6wk w/IPSS PVR.  I,Kailey Littlejohn,acting as a Education administrator for Hollice Espy, MD.,have documented all relevant documentation on the behalf of Hollice Espy, MD,as directed by  Hollice Espy, MD while in the presence of Hollice Espy, MD.   I have reviewed the above documentation for accuracy and completeness, and I agree with the above.   Hollice Espy, MD

## 2022-01-12 ENCOUNTER — Ambulatory Visit: Payer: Managed Care, Other (non HMO) | Admitting: Urology

## 2022-02-02 ENCOUNTER — Ambulatory Visit: Payer: Managed Care, Other (non HMO) | Admitting: Urology

## 2022-02-02 DIAGNOSIS — R39198 Other difficulties with micturition: Secondary | ICD-10-CM

## 2022-10-16 ENCOUNTER — Ambulatory Visit: Payer: Self-pay | Admitting: *Deleted

## 2022-10-16 NOTE — Telephone Encounter (Signed)
Reason for Disposition  [1] MILD difficulty breathing (e.g., minimal/no SOB at rest, SOB with walking, pulse <100) AND [2] still present when not coughing  Answer Assessment - Initial Assessment Questions 1. ONSET: "When did the cough begin?"      I had Covid in 2020 and in the hospital.   I having the same symptoms now.   Heavy congestion.  Someone sitting on my chest within the last 24 hrs.   This morning I was trying to sweat it out.  My muscles are stiff and sore.    2. SEVERITY: "How bad is the cough today?"      I have a cough with green mucus.  Not a lot but I did.    3. SPUTUM: "Describe the color of your sputum" (none, dry cough; clear, white, yellow, green)     Green mucus   My chest is tight.   I have a lot of heat in my head.   I want to be seen. 4. HEMOPTYSIS: "Are you coughing up any blood?" If so ask: "How much?" (flecks, streaks, tablespoons, etc.)     No 5. DIFFICULTY BREATHING: "Are you having difficulty breathing?" If Yes, ask: "How bad is it?" (e.g., mild, moderate, severe)    - MILD: No SOB at rest, mild SOB with walking, speaks normally in sentences, can lie down, no retractions, pulse < 100.    - MODERATE: SOB at rest, SOB with minimal exertion and prefers to sit, cannot lie down flat, speaks in phrases, mild retractions, audible wheezing, pulse 100-120.    - SEVERE: Very SOB at rest, speaks in single words, struggling to breathe, sitting hunched forward, retractions, pulse > 120      Short of breath 6. FEVER: "Do you have a fever?" If Yes, ask: "What is your temperature, how was it measured, and when did it start?"     Not sure 7. 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 8. LUNG HISTORY: "Do you have any history of lung disease?"  (e.g., pulmonary embolus, asthma, emphysema)     No 9. PE RISK FACTORS: "Do you have a history of blood clots?" (or: recent major surgery,  recent prolonged travel, bedridden)     No 10. OTHER SYMPTOMS: "Do you have any other symptoms?" (e.g., runny nose, wheezing, chest pain)       heavy 11. PREGNANCY: "Is there any chance you are pregnant?" "When was your last menstrual period?"       N/A 12. TRAVEL: "Have you traveled out of the country in the last month?" (e.g., travel history, exposures)       Not asked  Protocols used: Cough - Acute Productive-A-AH

## 2022-10-16 NOTE — Telephone Encounter (Signed)
Patient advised of office availability.  Recommended ER, UC or VV.

## 2022-10-16 NOTE — Telephone Encounter (Addendum)
  Chief Complaint: Thinks he has Covid again.  Had it in 2020 real bad and now having the same symptoms and feeling the same way.  Ended up with myocarditis with Covid.   Symptoms: Heavy congestion in chest, coughing up green mucus, not much, short of breath, chest feels heavy, head feels hot and tired, and muscle aches and joint aches.    He is going to check his temperature.   He does not have a home Covid test. Frequency: He started feeling bad yesterday afternoon.   Pertinent Negatives: Patient denies known exposure Disposition: [] ED /[] Urgent Care (no appt availability in office) / [x] Appointment(In office/virtual)/ []  [facility]/ [] Home Care/ [] Refused Recommended Disposition /[] [facility]/ []  Follow-up with PCP Additional Notes: No appts available today with any of the providers.   He is requesting to be worked in today if possible due to his history.   Any provider is fine though prefers Dr. Sherrie Mustache.   He is open to a virtual visit also but would prefer to come in to be evaluated.    I sent a high priority message to the practice as they are closed for lunch at the time of this call.    Pt. Agreeable to someone calling him back.    He asked that someone please call him back.   I instructed him to go to the ED if his symptoms become worse especially the chest heaviness and shortness of breath.

## 2022-12-10 ENCOUNTER — Ambulatory Visit: Payer: Managed Care, Other (non HMO) | Admitting: Family Medicine

## 2022-12-10 VITALS — BP 122/83 | HR 71 | Wt 202.5 lb

## 2022-12-10 DIAGNOSIS — R059 Cough, unspecified: Secondary | ICD-10-CM | POA: Diagnosis not present

## 2022-12-10 MED ORDER — AZITHROMYCIN 250 MG PO TABS: ORAL_TABLET | ORAL | 0 refills | Status: AC

## 2022-12-10 NOTE — Progress Notes (Signed)
Established patient visit   Patient: James Moore   DOB: [birth year]   72 y.o. Male  MRN: [id number] Visit Date: 12/10/2022  Today's healthcare provider: Mila Merry, MD   Chief Complaint  Patient presents with   Cough    Associated with congestion. Present for a few days. Has not taking covid test. Patients wife is not feeling well and believes he has caught what she has.    Subjective    Discussed the use of AI scribe software for clinical note transcription with the patient, who gave verbal consent to proceed.  History of Present Illness   The patient, with a history of COVID-19 and recent vaccination, presents with symptoms suggestive of a respiratory infection. He reports feeling unwell for the past few days, with symptoms including a 'popping' and 'gurgling' sensation in the bronchial tubes, and a productive cough with clear sputum. The patient notes that these symptoms are worse at night, with associated wheezing and chest tightness. He also reports feeling 'clammy' in the early morning hours. 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  The patient's wife, who has been experiencing similar symptoms, was recently seen at a clinic and subsequently in the emergency department. She was diagnosed with a respiratory infection and given steroids and a breathing treatment. However, his condition has not improved and he is now producing yellow and green sputum. The patient is concerned about his wife's condition, noting that her lungs are 'compromised' and that she is not 'turning the corner.'  The patient has been managing his symptoms with over-the-counter cough medicine and Mucinex DM. He has not had any previous issues with asthma or emphysema and has been generally healthy until the age of 37. The patient is seeking treatment to 'get ahead' of his symptoms and return to work.       Medications: Outpatient Medications Prior to Visit  Medication  Sig   Multiple Vitamin (MULTIVITAMIN WITH MINERALS) TABS tablet Take 2 tablets by mouth daily.   sildenafil (REVATIO) 20 MG tablet Take 1 tablet (20 mg total) by mouth as needed. Take 1-5 tablets one hour prior to intercourse   tamsulosin (FLOMAX) 0.4 MG CAPS capsule Take 1 capsule (0.4 mg total) by mouth daily.   No facility-administered medications prior to visit.      Objective    BP 122/83   Pulse 71   Wt 202 lb 8 oz (91.9 kg)   SpO2 100%   BMI 26.72 kg/m   Physical Exam   General: Appearance:    Well developed, well nourished male in no acute distress  Eyes:    PERRL, conjunctiva/corneas clear, EOM's intact       Lungs:     Rales in mid lung, left side. Occasional expiratory wheezes.  Heart:    Normal heart rate. Normal rhythm. No murmurs, rubs, or gallops.    MS:   All extremities are intact.    Neurologic:   Awake, alert, oriented x 3. No apparent focal neurological defect.       Covid Negative  Assessment & Plan     Assessment and Plan    Lower Respiratory Infection Patient presents with worsening cough, congestion, and wheezing, particularly at night. No history of asthma or emphysema. Auscultation reveals rattling in the mid-lungs, suggestive of early pneumonia. -Prescribe Azithromycin (Z-Pak) and send to [facility] in [place]. -Advise patient to monitor symptoms and seek immediate medical attention if condition worsens.  Patient's  Wife's Illness Patient's wife also ill with similar symptoms, not improving despite prednisone treatment. Patient reports his lungs are "totally compromised." -Prescribe Azithromycin (Z-Pak) for patient's wife. -Advise patient's wife to seek immediate medical attention if condition worsens or does not improve within 48 hours.          Mila Merry, MD  [facility] [phone] (phone) [phone] (fax)  [facility]

## 2023-07-17 DIAGNOSIS — I87393 Chronic venous hypertension (idiopathic) with other complications of bilateral lower extremity: Secondary | ICD-10-CM | POA: Diagnosis not present

## 2023-07-17 DIAGNOSIS — I872 Venous insufficiency (chronic) (peripheral): Secondary | ICD-10-CM | POA: Diagnosis not present

## 2023-07-17 DIAGNOSIS — G2581 Restless legs syndrome: Secondary | ICD-10-CM | POA: Diagnosis not present

## 2023-07-17 DIAGNOSIS — R252 Cramp and spasm: Secondary | ICD-10-CM | POA: Diagnosis not present

## 2023-07-22 DIAGNOSIS — I83892 Varicose veins of left lower extremities with other complications: Secondary | ICD-10-CM | POA: Diagnosis not present

## 2023-07-22 DIAGNOSIS — G2581 Restless legs syndrome: Secondary | ICD-10-CM | POA: Diagnosis not present

## 2023-07-22 DIAGNOSIS — R252 Cramp and spasm: Secondary | ICD-10-CM | POA: Diagnosis not present

## 2023-07-26 DIAGNOSIS — R252 Cramp and spasm: Secondary | ICD-10-CM | POA: Diagnosis not present

## 2023-07-26 DIAGNOSIS — I83891 Varicose veins of right lower extremities with other complications: Secondary | ICD-10-CM | POA: Diagnosis not present

## 2023-07-26 DIAGNOSIS — G2581 Restless legs syndrome: Secondary | ICD-10-CM | POA: Diagnosis not present

## 2023-08-29 ENCOUNTER — Ambulatory Visit: Admitting: Dermatology

## 2023-08-29 DIAGNOSIS — Z1283 Encounter for screening for malignant neoplasm of skin: Secondary | ICD-10-CM | POA: Diagnosis not present

## 2023-08-29 DIAGNOSIS — D692 Other nonthrombocytopenic purpura: Secondary | ICD-10-CM

## 2023-08-29 DIAGNOSIS — C44612 Basal cell carcinoma of skin of right upper limb, including shoulder: Secondary | ICD-10-CM

## 2023-08-29 DIAGNOSIS — L814 Other melanin hyperpigmentation: Secondary | ICD-10-CM | POA: Diagnosis not present

## 2023-08-29 DIAGNOSIS — L578 Other skin changes due to chronic exposure to nonionizing radiation: Secondary | ICD-10-CM

## 2023-08-29 DIAGNOSIS — B351 Tinea unguium: Secondary | ICD-10-CM

## 2023-08-29 DIAGNOSIS — I781 Nevus, non-neoplastic: Secondary | ICD-10-CM

## 2023-08-29 DIAGNOSIS — D1801 Hemangioma of skin and subcutaneous tissue: Secondary | ICD-10-CM | POA: Diagnosis not present

## 2023-08-29 DIAGNOSIS — L821 Other seborrheic keratosis: Secondary | ICD-10-CM | POA: Diagnosis not present

## 2023-08-29 DIAGNOSIS — B353 Tinea pedis: Secondary | ICD-10-CM | POA: Diagnosis not present

## 2023-08-29 DIAGNOSIS — W908XXA Exposure to other nonionizing radiation, initial encounter: Secondary | ICD-10-CM

## 2023-08-29 DIAGNOSIS — D489 Neoplasm of uncertain behavior, unspecified: Secondary | ICD-10-CM

## 2023-08-29 DIAGNOSIS — I8393 Asymptomatic varicose veins of bilateral lower extremities: Secondary | ICD-10-CM

## 2023-08-29 DIAGNOSIS — D492 Neoplasm of unspecified behavior of bone, soft tissue, and skin: Secondary | ICD-10-CM

## 2023-08-29 DIAGNOSIS — D229 Melanocytic nevi, unspecified: Secondary | ICD-10-CM

## 2023-08-29 DIAGNOSIS — I83891 Varicose veins of right lower extremities with other complications: Secondary | ICD-10-CM | POA: Diagnosis not present

## 2023-08-29 MED ORDER — TERBINAFINE HCL 250 MG PO TABS: 250.0000 mg | ORAL_TABLET | Freq: Every day | ORAL | 0 refills | Status: AC

## 2023-08-29 NOTE — Patient Instructions (Addendum)
 Terbinafine Counseling  Terbinafine is an anti-fungal medicine that can be applied to the skin (over the counter) or taken by mouth (prescription) to treat fungal infections. The pill version is often used to treat fungal infections of the nails or scalp. While most people do not have any side effects from taking terbinafine pills, some possible side effects of the medicine can include taste changes, headache, loss of smell, vision changes, nausea, vomiting, or diarrhea.   Rare side effects can include irritation of the liver, allergic reaction, or decrease in blood counts (which may show up as not feeling well or developing an infection). If you are concerned about any of these side effects, please stop the medicine and call your doctor, or in the case of an emergency such as feeling very unwell, seek immediate medical care.        Biopsy Wound Care Instructions  Leave the original bandage on for 24 hours if possible.  If the bandage becomes soaked or soiled before that time, it is OK to remove it and examine the wound.  A small amount of post-operative bleeding is normal.  If excessive bleeding occurs, remove the bandage, place gauze over the site and apply continuous pressure (no peeking) over the area for 30 minutes. If this does not work, please call our clinic as soon as possible or page your doctor if it is after hours.   Once a day, cleanse the wound with soap and water. It is fine to shower. If a thick crust develops you may use a Q-tip dipped into dilute hydrogen peroxide (mix 1:1 with water) to dissolve it.  Hydrogen peroxide can slow the healing process, so use it only as needed.    After washing, apply petroleum jelly (Vaseline) or an antibiotic ointment if your doctor prescribed one for you, followed by a bandage.    For best healing, the wound should be covered with a layer of ointment at all times. If you are not able to keep the area covered with a bandage to hold the ointment  in place, this may mean re-applying the ointment several times a day.  Continue this wound care until the wound has healed and is no longer open.   Itching and mild discomfort is normal during the healing process. However, if you develop pain or severe itching, please call our office.   If you have any discomfort, you can take Tylenol (acetaminophen) or ibuprofen as directed on the bottle. (Please do not take these if you have an allergy to them or cannot take them for another reason).  Some redness, tenderness and white or yellow material in the wound is normal healing.  If the area becomes very sore and red, or develops a thick yellow-green material (pus), it may be infected; please notify us.    If you have stitches, return to clinic as directed to have the stitches removed. You will continue wound care for 2-3 days after the stitches are removed.   Wound healing continues for up to one year following surgery. It is not unusual to experience pain in the scar from time to time during the interval.  If the pain becomes severe or the scar thickens, you should notify the office.    A slight amount of redness in a scar is expected for the first six months.  After six months, the redness will fade and the scar will soften and fade.  The color difference becomes less noticeable with time.  If there are  any problems, return for a post-op surgery check at your earliest convenience.  To improve the appearance of the scar, you can use silicone scar gel, cream, or sheets (such as Mederma or Serica) every night for up to one year. These are available over the counter (without a prescription).  Please call our office at [phone] for any questions or concerns.      Melanoma ABCDEs    Melanoma is the most dangerous type of skin cancer, and is the leading cause of death from skin disease.  You are more likely to develop melanoma if you: Have light-colored skin, light-colored eyes, or red or blond  hair Spend a lot of time in the sun Tan regularly, either outdoors or in a tanning bed Have had blistering sunburns, especially during childhood Have a close family member who has had a melanoma Have atypical moles or large birthmarks  Early detection of melanoma is key since treatment is typically straightforward and cure rates are extremely high if we catch it early.   The first sign of melanoma is often a change in a mole or a new dark spot.  The ABCDE system is a way of remembering the signs of melanoma.  A for asymmetry:  The two halves do not match. B for border:  The edges of the growth are irregular. C for color:  A mixture of colors are present instead of an even brown color. D for diameter:  Melanomas are usually (but not always) greater than 6mm - the size of a pencil eraser. E for evolution:  The spot keeps changing in size, shape, and color.  Please check your skin once per month between visits. You can use a small mirror in front and a large mirror behind you to keep an eye on the back side or your body.   If you see any new or changing lesions before your next follow-up, please call to schedule a visit.  Please continue daily skin protection including broad spectrum sunscreen SPF 30+ to sun-exposed areas, reapplying every 2 hours as needed when you're outdoors.   Staying in the shade or wearing long sleeves, sun glasses (UVA+UVB protection) and wide brim hats (4-inch brim around the entire circumference of the hat) are also recommended for sun protection.     Due to recent changes in healthcare laws, you may see results of your pathology and/or laboratory studies on MyChart before the doctors have had a chance to review them. We understand that in some cases there may be results that are confusing or concerning to you. Please understand that not all results are received at the same time and often the doctors may need to interpret multiple results in order to provide you with  the best plan of care or course of treatment. Therefore, we ask that you please give [place] 2 business days to thoroughly review all your results before contacting the office for clarification. Should we see a critical lab result, you will be contacted sooner.   If You Need Anything After Your Visit  If you have any questions or concerns for your doctor, please call our main line at [phone] and press option 4 to reach your doctor's medical assistant. If no one answers, please leave a voicemail as directed and we will return your call as soon as possible. Messages left after 4 pm will be answered the following business day.   You may also send [place] a message via MyChart. We typically respond to MyChart messages within  1-2 business days.  For prescription refills, please ask your pharmacy to contact our office. Our fax number is [phone].  If you have an urgent issue when the clinic is closed that cannot wait until the next business day, you can page your doctor at the number below.    Please note that while we do our best to be available for urgent issues outside of office hours, we are not available 24/7.   If you have an urgent issue and are unable to reach [place], you may choose to seek medical care at your doctor's office, retail clinic, urgent care center, or emergency room.  If you have a medical emergency, please immediately call 911 or go to the emergency department.  Pager Numbers  - Dr. Gwen Pounds: [phone]  - Dr. Roseanne Reno: [phone]  - Dr. Katrinka Blazing: [phone]   In the event of inclement weather, please call our main line at [phone] for an update on the status of any delays or closures.  Dermatology Medication Tips: Please keep the boxes that topical medications come in in order to help keep track of the instructions about where and how to use these. Pharmacies typically print the medication instructions only on the boxes and not directly on the medication tubes.   If  your medication is too expensive, please contact our office at [phone] option 4 or send [place] a message through MyChart.   We are unable to tell what your co-pay for medications will be in advance as this is different depending on your insurance coverage. However, we may be able to find a substitute medication at lower cost or fill out paperwork to get insurance to cover a needed medication.   If a prior authorization is required to get your medication covered by your insurance company, please allow [place] 1-2 business days to complete this process.  Drug prices often vary depending on where the prescription is filled and some pharmacies may offer cheaper prices.  The website www.goodrx.com contains coupons for medications through different pharmacies. The prices here do not account for what the cost may be with help from insurance (it may be cheaper with your insurance), but the website can give you the price if you did not use any insurance.  - You can print the associated coupon and take it with your prescription to the pharmacy.  - You may also stop by our office during regular business hours and pick up a GoodRx coupon card.  - If you need your prescription sent electronically to a different pharmacy, notify our office through [facility] or by phone at [phone] option 4.     Si Usted Necesita Algo Despus de Su Visita  Tambin puede enviarnos un mensaje a travs de Clinical cytogeneticist. Por lo general respondemos a los mensajes de MyChart en el transcurso de 1 a 2 das hbiles.  Para renovar recetas, por favor pida a su farmacia que se ponga en contacto con nuestra oficina. Annie Sable de fax es [place] [phone].  Si tiene un asunto urgente cuando la clnica est cerrada y que no puede esperar hasta el siguiente da hbil, puede llamar/localizar a su doctor(a) al nmero que aparece a continuacin.   Por favor, tenga en cuenta que aunque hacemos todo lo posible para estar disponibles para  asuntos urgentes fuera del horario de [place], no estamos disponibles las 24 horas del da, los 7 [address]  [address] de la [place].   Si tiene un problema urgente y no puede comunicarse con nosotros, puede optar por  buscar atencin mdica  en el consultorio de su doctor(a), en una clnica privada, en un centro de atencin urgente o en una sala de emergencias.  Si tiene Engineer, drilling, por favor llame inmediatamente al 911 o vaya a la sala de emergencias.  Nmeros de bper  - Dr. Gwen Pounds: [phone]  - Dra. Roseanne Reno: [phone]  - Dr. Katrinka Blazing: [phone]   En caso de inclemencias del tiempo, por favor llame a Lacy Duverney principal al [phone] para una actualizacin sobre el [place] de cualquier retraso o cierre.  Consejos para la medicacin en dermatologa: Por favor, guarde las cajas en las que vienen los medicamentos de uso tpico para ayudarle a seguir las instrucciones sobre dnde y cmo usarlos. Las farmacias generalmente imprimen las instrucciones del medicamento slo en las cajas y no directamente en los tubos del [place].   Si su medicamento es muy caro, por favor, pngase en contacto con Rolm Gala llamando al [phone] y presione la opcin 4 o envenos un mensaje a travs de Clinical cytogeneticist.   No podemos decirle cul ser su copago por los medicamentos por adelantado ya que esto es diferente dependiendo de la cobertura de su seguro. Sin embargo, es posible que podamos encontrar un medicamento sustituto a Audiological scientist un formulario para que el seguro cubra el medicamento que se considera necesario.   Si se requiere una autorizacin previa para que su compaa de seguros [place] su medicamento, por favor permtanos de 1 a 2 das hbiles para completar [address]  Los precios de los medicamentos varan con frecuencia dependiendo del Environmental consultant de dnde se surte la receta y alguna farmacias pueden ofrecer precios ms baratos.  El sitio web www.goodrx.com tiene cupones para  medicamentos de Health and safety inspector. Los precios aqu no tienen en cuenta lo que podra costar con la ayuda del seguro (puede ser ms barato con su seguro), pero el sitio web puede darle el precio si no utiliz Tourist information centre manager.  - Puede imprimir el cupn correspondiente y llevarlo con su receta a la farmacia.  - Tambin puede pasar por nuestra oficina durante el horario de atencin regular y Education officer, museum una tarjeta de cupones de GoodRx.  - Si necesita que su receta se enve electrnicamente a una farmacia diferente, informe a nuestra oficina a travs de MyChart de [place] o por telfono llamando al [phone] y presione la opcin 4.

## 2023-08-29 NOTE — Progress Notes (Signed)
 Follow-Up Visit   Subjective  James Moore is a 73 y.o. male who presents for the following: Skin Cancer Screening and Full Body Skin Exam Hx of isks, hx of bcc, hx of dysplastic nevi, hx of tinea unguium has used terbinafine  in past.   Spot at right hand, a spot at right shoulder clavicle area noticed a year ago looks very red and inflamed, photo taken Check left toe, possible fungus Patient has history  of taking terbinafine  2 courses.  Nub at   The patient presents for Total-Body Skin Exam (TBSE) for skin cancer screening and mole check. The patient has spots, moles and lesions to be evaluated, some may be new or changing and the patient may have concern these could be cancer.    The following portions of the chart were reviewed this encounter and updated as appropriate: medications, allergies, medical history  Review of Systems:  No other skin or systemic complaints except as noted in HPI or Assessment and Plan.  Objective  Well appearing patient in no apparent distress; mood and affect are within normal limits.  A full examination was performed including scalp, head, eyes, ears, nose, lips, neck, chest, axillae, abdomen, back, buttocks, bilateral upper extremities, bilateral lower extremities, hands, feet, fingers, toes, fingernails, and toenails. All findings within normal limits unless otherwise noted below.   Relevant physical exam findings are noted in the Assessment and Plan.  right clavicle 1.0 pink ulceration  right inferior chest 0.7 mm brown macule    Assessment & Plan   SKIN CANCER SCREENING PERFORMED TODAY.  ACTINIC DAMAGE - Chronic condition, secondary to cumulative UV/sun exposure - diffuse scaly erythematous macules with underlying dyspigmentation - Recommend daily broad spectrum sunscreen SPF 30+ to sun-exposed areas, reapply every 2 hours as needed.  - Staying in the shade or wearing long sleeves, sun glasses (UVA+UVB protection) and wide brim hats  (4-inch brim around the entire circumference of the hat) are also recommended for sun protection.  - Call for new or changing lesions.  LENTIGINES, SEBORRHEIC KERATOSES, HEMANGIOMAS - Benign normal skin lesions - Benign-appearing - Call for any changes  Purpura - Chronic; persistent and recurrent.  Treatable, but not curable. - Violaceous macules and patches - Benign - Related to trauma, age, sun damage and/or use of blood thinners, chronic use of topical and/or oral steroids - Observe - Can use OTC arnica containing moisturizer such as Dermend Bruise Formula if desired - Call for worsening or other concerns  MELANOCYTIC NEVI - Tan-brown and/or pink-flesh-colored symmetric macules and papules - Benign appearing on exam today - Observation - Call clinic for new or changing moles - Recommend daily use of broad spectrum spf 30+ sunscreen to sun-exposed areas.   Varicose Veins/Spider Veins - Dilated blue, purple or red veins at the lower extremities - Reassured - Smaller vessels can be treated by sclerotherapy (a procedure to inject a medicine into the veins to make them disappear) if desired, but the treatment is not covered by insurance. Larger vessels may be covered if symptomatic and we would refer to vascular surgeon if treatment desired. Being seen and followed by vein specialist in [place]  Tinea unguium and pedis, s/p terbinafine  x 2 rounds Exam: L great toenail discolouration and onychauxis. 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 Stop terbinafine  if side effects occur Otc lamisil  cream - apply twice daily to feet and area  Start terbinafine  250 mg take 1 po daily x 12 weeks  Terbinafine  Counseling  Terbinafine  is an anti-fungal medicine that can be applied to the skin (over the counter) or taken by mouth (prescription) to treat fungal infections. The pill  version is often used to treat fungal infections of the nails or scalp. While most people do not have any side effects from taking terbinafine  pills, some possible side effects of the medicine can include taste changes, headache, loss of smell, vision changes, nausea, vomiting, or diarrhea.   Rare side effects can include irritation of the liver, allergic reaction, or decrease in blood counts (which may show up as not feeling well or developing an infection). If you are concerned about any of these side effects, please stop the medicine and call your doctor, or in the case of an emergency such as feeling very unwell, seek immediate medical care.     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  Call if any new or changing lesions are noted between office visits  TINEA UNGUIUM   Related Medications terbinafine  (LAMISIL ) 250 MG tablet Take 1 tablet (250 mg total) by mouth daily. NEOPLASM OF UNCERTAIN BEHAVIOR (2) right clavicle Skin / nail biopsy Type of biopsy: tangential   Informed consent: discussed and consent obtained   Timeout: patient name, date of birth, surgical site, and procedure verified   Procedure prep:  Patient was prepped and draped in usual sterile fashion Prep type:  Isopropyl alcohol Anesthesia: the lesion was anesthetized in a standard fashion   Anesthetic:  1% lidocaine  w/ epinephrine  1-100,000 buffered w/ 8.4%  NaHCO3 Instrument used: DermaBlade   Hemostasis achieved with: pressure and aluminum chloride   Outcome: patient tolerated procedure well   Post-procedure details: sterile dressing applied and wound care instructions given   Dressing type: bandage and petrolatum   Specimen 1 - Surgical pathology Differential Diagnosis: r/o bcc   Check Margins: No right inferior chest Skin / nail biopsy Type of biopsy: tangential   Informed consent: discussed and consent obtained   Timeout: patient name, date of birth, surgical site, and procedure verified   Procedure prep:  Patient was prepped and draped in usual sterile fashion Prep type:  Isopropyl alcohol Anesthesia: the lesion was anesthetized in a standard fashion   Anesthetic:  1% lidocaine  w/ epinephrine  1-100,000 buffered w/ 8.4% NaHCO3 Instrument used: DermaBlade   Hemostasis achieved with: pressure and aluminum chloride   Outcome: patient tolerated procedure well   Post-procedure details: sterile dressing applied and wound care instructions given   Dressing type: bandage and petrolatum   Specimen 2 - Surgical pathology Differential Diagnosis: nevus r/o dysplastic nevus   Check Margins: no R/o bcc at right clavicle and r/o dysplastic nevus at right inferior chest LENTIGINES   MULTIPLE BENIGN NEVI   SEBORRHEIC KERATOSES   ACTINIC ELASTOSIS   CHERRY ANGIOMA   SOLAR PURPURA (HCC)   Return in about 1 year (around 08/28/2024) for TBSE.  I, Randee Busing, CMA, am acting as scribe for Harris Liming, MD.   Documentation: I have reviewed the above documentation for accuracy and completeness, and I agree with the above.  Harris Liming, MD

## 2023-09-01 ENCOUNTER — Encounter: Payer: Self-pay | Admitting: Dermatology

## 2023-09-04 ENCOUNTER — Ambulatory Visit: Payer: Self-pay | Admitting: Dermatology

## 2023-09-04 LAB — SURGICAL PATHOLOGY

## 2023-09-05 ENCOUNTER — Encounter: Payer: Self-pay | Admitting: Dermatology

## 2023-09-05 NOTE — Telephone Encounter (Signed)
 Advised pt of bx results.  Discussed excision for BCC.  Scheduled pt for surgery on 10/30/23./sh

## 2023-09-05 NOTE — Telephone Encounter (Signed)
-----   Message from [facility] sent at 09/04/2023  7:19 PM EDT ----- Diagnosis: 1. Skin, right clavicle :       BASAL CELL CARCINOMA, NODULAR PATTERN        2. Skin, right inferior chest :       PIGMENTED SEBORRHEIC KERATOSIS, EARLY    Please call to share diagnosis and discuss treatment options.  1.  Explanation: your biopsy shows basal cell skin cancers in the second layer of the skin. This is the most common kind of skin cancer and is caused by damage from sun exposure. Basal cell skin cancers almost never spread beyond the skin, so they are not dangerous to your overall health. However, they will continue to grow, can bleed, cause nonhealing wounds, and disrupt nearby structures unless fully treated.   Treatment: Excision - you return for an hour long appointment in our clinic where we perform a skin surgery. We numb the site of the skin cancer and a safety margin of normal skin around it. We remove the full thickness of skin and close the wound with two layers of stitches. The sample is sent to the lab to check that the skin cancer was fully removed. Return one week later to have wound checked and surface stitches removed. Surgical wound leaves a line scar. Approximately 95% cure rate. Risk of recurrence, bleeding, infection, pain, injury to nearby structures, hypertrophic scar.  2. Biopsy shows benign keratosis (thickening of top layer of skin). No treatment needed

## 2023-09-16 DIAGNOSIS — M7989 Other specified soft tissue disorders: Secondary | ICD-10-CM | POA: Diagnosis not present

## 2023-09-16 DIAGNOSIS — M79604 Pain in right leg: Secondary | ICD-10-CM | POA: Diagnosis not present

## 2023-09-16 DIAGNOSIS — I83811 Varicose veins of right lower extremities with pain: Secondary | ICD-10-CM | POA: Diagnosis not present

## 2023-10-22 DIAGNOSIS — I8001 Phlebitis and thrombophlebitis of superficial vessels of right lower extremity: Secondary | ICD-10-CM | POA: Diagnosis not present

## 2023-10-30 ENCOUNTER — Ambulatory Visit (INDEPENDENT_AMBULATORY_CARE_PROVIDER_SITE_OTHER): Admitting: Dermatology

## 2023-10-30 ENCOUNTER — Encounter: Payer: Self-pay | Admitting: Dermatology

## 2023-10-30 DIAGNOSIS — Z7189 Other specified counseling: Secondary | ICD-10-CM

## 2023-10-30 DIAGNOSIS — C44612 Basal cell carcinoma of skin of right upper limb, including shoulder: Secondary | ICD-10-CM

## 2023-10-30 DIAGNOSIS — L821 Other seborrheic keratosis: Secondary | ICD-10-CM | POA: Diagnosis not present

## 2023-10-30 DIAGNOSIS — Z85828 Personal history of other malignant neoplasm of skin: Secondary | ICD-10-CM | POA: Diagnosis not present

## 2023-10-30 DIAGNOSIS — I83811 Varicose veins of right lower extremities with pain: Secondary | ICD-10-CM | POA: Diagnosis not present

## 2023-10-30 DIAGNOSIS — I781 Nevus, non-neoplastic: Secondary | ICD-10-CM | POA: Diagnosis not present

## 2023-10-30 DIAGNOSIS — I83891 Varicose veins of right lower extremities with other complications: Secondary | ICD-10-CM | POA: Diagnosis not present

## 2023-10-30 NOTE — Progress Notes (Signed)
   Follow-Up Visit   Subjective  James Moore is a 73 y.o. 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  Objective  Well appearing patient in no apparent distress; mood and affect are within normal limits.  A focused examination was performed of the following areas: chest  Relevant exam findings are noted in the Assessment and Plan.    Assessment & Plan   BCC Bx proven R clavicle Exam: pink bx site with peripheral pink papule with telangiectasias  Treatment Plan: Clinically consistent with persistent BCC after biopsy. BCC does sometimes resolve after biopsy. Lesion is no longer symptomatic so patient prefers to observe and recheck in the Fall. BCC rarely spreads and grows slowly. If still present, it will become more obvious with time. Discussed excising, pt prefers to observe and recheck in 3 months  TELANGIECTASIAS face Exam: dilated blood vessel(s) on nasal tip and dorsum  Treatment Plan: Benign appearing on exam Call for changes Counseling for BBL / IPL / Laser and Coordination of Care Discussed the treatment option of Broad Band Light (BBL) /Intense Pulsed Light (IPL)/ Laser for skin discoloration, including brown spots and redness.  Typically we recommend at least 1-3 treatment sessions about 5-8 weeks apart for best results.  Cannot have tanned skin when BBL performed, and regular use of sunscreen/photoprotection is advised after the procedure to help maintain results. The patient's condition may also require maintenance treatments in the future.  The fee for BBL / laser treatments is $350 per treatment  session for the whole face.  A fee can be quoted for other parts of the body.  Insurance typically does not pay for BBL/laser treatments and therefore the fee is an out-of-pocket cost. Recommend prophylactic valtrex treatment. Once scheduled for procedure, will send Rx in prior to patient's appointment.   SEBORRHEIC KERATOSIS R mid back - Stuck-on, waxy, tan-brown papules and/or plaques  - Benign-appearing - Discussed benign etiology and prognosis. - Observe - Call for any changes   TELANGIECTASIA   SEBORRHEIC KERATOSES   HISTORY OF BASAL CELL CANCER   Return in about 3 months (around 01/30/2024) for recheck [facility] R clavicle.  I, Grayce Saunas, RMA, am acting as scribe for Boneta Sharps, MD .   Documentation: I have reviewed the above documentation for accuracy and completeness, and I agree with the above.  Boneta Sharps, MD

## 2023-10-30 NOTE — Patient Instructions (Addendum)

## 2023-11-19 ENCOUNTER — Other Ambulatory Visit: Payer: Self-pay | Admitting: Dermatology

## 2023-11-19 DIAGNOSIS — B351 Tinea unguium: Secondary | ICD-10-CM

## 2023-12-03 ENCOUNTER — Ambulatory Visit

## 2024-01-03 ENCOUNTER — Encounter: Payer: Self-pay | Admitting: Family Medicine

## 2024-01-03 ENCOUNTER — Ambulatory Visit (INDEPENDENT_AMBULATORY_CARE_PROVIDER_SITE_OTHER): Admitting: Family Medicine

## 2024-01-03 VITALS — BP 132/84 | HR 65 | Ht 73.0 in | Wt 195.8 lb

## 2024-01-03 DIAGNOSIS — I83813 Varicose veins of bilateral lower extremities with pain: Secondary | ICD-10-CM | POA: Diagnosis not present

## 2024-01-03 DIAGNOSIS — Z125 Encounter for screening for malignant neoplasm of prostate: Secondary | ICD-10-CM | POA: Diagnosis not present

## 2024-01-03 DIAGNOSIS — Z136 Encounter for screening for cardiovascular disorders: Secondary | ICD-10-CM

## 2024-01-03 DIAGNOSIS — Z23 Encounter for immunization: Secondary | ICD-10-CM | POA: Diagnosis not present

## 2024-01-03 DIAGNOSIS — Z0000 Encounter for general adult medical examination without abnormal findings: Secondary | ICD-10-CM | POA: Diagnosis not present

## 2024-01-03 NOTE — Patient Instructions (Signed)
 Please review the attached list of medications and notify my office if there are any errors.   I strongly recommend two doses of Shingrix (the shingles vaccine) separated by 2 to 6 months for adults age 71 years and older. I recommend checking with your insurance plan regarding coverage for this vaccine.

## 2024-01-04 LAB — COMPREHENSIVE METABOLIC PANEL WITH GFR
ALT: 14 IU/L (ref 0–44)
AST: 16 IU/L (ref 0–40)
Albumin: 4.6 g/dL (ref 3.9–4.9)
Alkaline Phosphatase: 64 IU/L (ref 47–123)
BUN/Creatinine Ratio: 15 (ref 10–24)
BUN: 16 mg/dL (ref 8–27)
Bilirubin Total: 0.8 mg/dL (ref 0.0–1.2)
CO2: 23 mmol/L (ref 20–29)
Calcium: 9.7 mg/dL (ref 8.6–10.2)
Chloride: 100 mmol/L (ref 96–106)
Creatinine, Ser: 1.05 mg/dL (ref 0.76–1.27)
Globulin, Total: 2.1 g/dL (ref 1.5–4.5)
Glucose: 98 mg/dL (ref 70–99)
Potassium: 4.9 mmol/L (ref 3.5–5.2)
Sodium: 138 mmol/L (ref 134–144)
Total Protein: 6.7 g/dL (ref 6.0–8.5)
eGFR: 76 mL/min/1.73 (ref >59)

## 2024-01-04 LAB — CBC
Hematocrit: 45.6 % (ref 37.5–51.0)
Hemoglobin: 14.9 g/dL (ref 13.0–17.7)
MCH: 28.9 pg (ref 26.6–33.0)
MCHC: 32.7 g/dL (ref 31.5–35.7)
MCV: 89 fL (ref 79–97)
Platelets: 228 x10E3/uL (ref 150–450)
RBC: 5.15 x10E6/uL (ref 4.14–5.80)
RDW: 12.1 % (ref 11.6–15.4)
WBC: 6.1 x10E3/uL (ref 3.4–10.8)

## 2024-01-04 LAB — PSA TOTAL (REFLEX TO FREE): Prostate Specific Ag, Serum: 6.8 ng/mL — ABNORMAL HIGH (ref 0.0–4.0)

## 2024-01-04 LAB — FPSA% REFLEX
% FREE PSA: 32.6 %
PSA, FREE: 2.22 ng/mL

## 2024-01-04 LAB — LIPID PANEL
Chol/HDL Ratio: 3.1 ratio (ref 0.0–5.0)
Cholesterol, Total: 175 mg/dL (ref 100–199)
HDL: 56 mg/dL (ref >39)
LDL Chol Calc (NIH): 107 mg/dL — ABNORMAL HIGH (ref 0–99)
Triglycerides: 65 mg/dL (ref 0–149)
VLDL Cholesterol Cal: 12 mg/dL (ref 5–40)

## 2024-01-06 ENCOUNTER — Ambulatory Visit: Payer: Self-pay | Admitting: Family Medicine

## 2024-01-08 NOTE — Progress Notes (Signed)
 Complete physical exam   Patient: James Moore   DOB: 08/09/1950   73 y.o. Male  MRN: [id number] Visit Date: 01/03/2024  Today's healthcare provider: Nancyann Perry, MD   Chief Complaint  Patient presents with   Annual Exam    Last completed 08/15/21 Diet -  General, healthy Exercise - water aerobic, [employer] for about an hour and a half, pickle ball Feeling - well  Sleeping - well Concerns -  discuss vascular surgeon referral   Subjective    Discussed the use of AI scribe software for clinical note transcription with the patient, who gave verbal consent to proceed.  History of Present Illness   James Moore is a 73 year old male who presents for a routine physical exam.  He has ongoing issues with his veins, particularly below the knees, and reports that after multiple ultrasounds, he was told the valves are not closing properly, resulting in blood flow from the knees down. He describes sensations in his legs as feeling like 'squiggly worms' and experiences pain, heat, and cold sensations. He has undergone multiple procedures on one vein, which have not resolved the issue, and he is hesitant to pursue further procedures on other veins due to previous pain.  He has a history of hernias and is considering a colonoscopy, noting that his last one was in March 2017. There is no family history of colon cancer, and he has not had polyps.  He is active, playing pickleball almost every day. He has not received the pneumonia vaccine but plans to get it along with the flu shot. He has had COVID-19 three times, with the first instance being the most severe, and subsequent infections resembling a cold.  He reports some hearing issues, particularly with the left ear, where he feels the canal is tighter and affects his hearing. He also mentions changes in his vision post-cataract surgery, with his pupils not reacting as they did before.  He was previously on Flomax  for urinary issues  but stopped in March. He experiences periodic difficulty urinating, typically getting up once a night to use the bathroom.      HPI     Annual Exam    Additional comments: Last completed 08/15/21 Diet -  General, healthy Exercise - water aerobic, [employer] for about an hour and a half, pickle ball Feeling - well  Sleeping - well Concerns -  discuss vascular surgeon referral      Last edited by James Moore, CMA on 01/03/2024 10:36 AM.       Past Medical History:  Diagnosis Date   Arthritis    Basal cell carcinoma 09/07/2008   R sideburn within hairline    Basal cell carcinoma 08/29/2023   right clavicle, 10/30/23 pt came to surgery appt and wanted to observe and recheck in 3 months   Cataract    Removed in 2022   Colon polyps    COVID-19 02/2019   Dysplastic nevus 03/28/2010   L lat thigh near knee - mild    Hemorrhoid    prn otc hem.cream   History of MRSA infection    Hypertension    marginal high   Myocardial infarction (HCC) 04-17-19   Covid   Sleep apnea    lost weight so dc'd cpap   Squamous cell carcinoma of skin    Past Surgical History:  Procedure Laterality Date   CATARACT EXTRACTION Left 03/2019   COLONOSCOPY  2007   EYE SURGERY  2022   Cataracts   HERNIA REPAIR  2021   KNEE SURGERY Left    2005-2006- meniscus repair   POLYPECTOMY     UMBILICAL HERNIA REPAIR N/A 03/19/2019   Procedure: HERNIA REPAIR UMBILICAL ADULT;  Surgeon: James Schanz, MD;  Location: [facility] ORS;  Service: General;  Laterality: N/A;   Social History   Socioeconomic History   Marital status: Married    Spouse name: Not on file   Number of children: 0   Years of education: Not on file   Highest education level: Bachelor's degree (e.g., BA, AB, BS)  Occupational History   Occupation: Sales  Tobacco Use   Smoking status: Never   Smokeless tobacco: Never  Vaping Use   Vaping status: Never Used  Substance and Sexual Activity   Alcohol use: Not Currently    Alcohol/week: 0.0  standard drinks of alcohol   Drug use: No   Sexual activity: Not on file  Other Topics Concern   Not on file  Social History Narrative   Not on file   Social Drivers of Health   Financial Resource Strain: Low Risk  (12/10/2022)   Overall Financial Resource Strain (CARDIA)    Difficulty of Paying Living Expenses: Not hard at all  Food Insecurity: No Food Insecurity (12/10/2022)   Hunger Vital Sign    Worried About Running Out of Food in the Last Year: Never true    Ran Out of Food in the Last Year: Never true  Transportation Needs: No Transportation Needs (12/10/2022)   PRAPARE - Administrator, Civil Service (Medical): No    Lack of Transportation (Non-Medical): No  Physical Activity: Insufficiently Active (12/10/2022)   Exercise Vital Sign    Days of Exercise per Week: 2 days    Minutes of Exercise per Session: 30 min  Stress: No Stress Concern Present (12/10/2022)   [employer] of Occupational Health - Occupational Stress Questionnaire    Feeling of Stress : Only a little  Social Connections: Socially Integrated (12/10/2022)   Social Connection and Isolation Panel    Frequency of Communication with Friends and Family: Three times a week    Frequency of Social Gatherings with Friends and Family: Once a week    Attends Religious Services: 1 to 4 times per year    Active Member of [employer] or Organizations: Yes    Attends Engineer, structural: More than 4 times per year    Marital Status: Married  Catering manager Violence: Not on file   Family Status  Relation Name Status   Mother  Deceased   Father  Deceased   Neg Hx  (Not Specified)  No partnership data on file   Family History  Problem Relation Age of Onset   Crohn's disease Mother    Colon polyps Mother    Dementia Father    Colon cancer Neg Hx    Rectal cancer Neg Hx    Stomach cancer Neg Hx    No Known Allergies  Patient Care Team: James James BRAVO, MD as PCP - General (Family Medicine)    Medications: Outpatient Medications Prior to Visit  Medication Sig   Multiple Vitamin (MULTIVITAMIN WITH MINERALS) TABS tablet Take 2 tablets by mouth daily.   sildenafil  (REVATIO ) 20 MG tablet Take 1 tablet (20 mg total) by mouth as needed. Take 1-5 tablets one hour prior to intercourse   terbinafine  (LAMISIL ) 250 MG tablet Take by mouth.   tamsulosin  (FLOMAX ) 0.4 MG CAPS capsule  Take 1 capsule (0.4 mg total) by mouth daily. (Patient not taking: Reported on 01/03/2024)   No facility-administered medications prior to visit.    Review of Systems    Objective    BP 132/84   Pulse 65   Ht 6' 1 (1.854 m)   Wt 195 lb 12.8 oz (88.8 kg)   SpO2 100%   BMI 25.83 kg/m    Physical Exam  General Appearance:    Well developed, well nourished male. Alert, cooperative, in no acute distress, appears stated age  Head:    Normocephalic, without obvious abnormality, atraumatic  Eyes:    PERRL, conjunctiva/corneas clear, EOM's intact, fundi    benign, both eyes       Ears:    Normal TM's and external ear canals, both ears  Nose:   Nares normal, septum midline, mucosa normal, no drainage   or sinus tenderness  Throat:   Lips, mucosa, and tongue normal; teeth and gums normal  Neck:   Supple, symmetrical, trachea midline, no adenopathy;       thyroid:  No enlargement/tenderness/nodules; no carotid   bruit or JVD  Back:     Symmetric, no curvature, ROM normal, no CVA tenderness  Lungs:     Clear to auscultation bilaterally, respirations unlabored  Chest wall:    No tenderness or deformity  Heart:    Normal heart rate. Normal rhythm. No murmurs, rubs, or gallops.  S1 and S2 normal  Abdomen:     Soft, non-tender, bowel sounds active all four quadrants,    no masses, no organomegaly  Genitalia:    deferred  Rectal:    deferred  Extremities:   All extremities are intact. No cyanosis or edema. Scattered spider veins and mild varicosities both lower legs.   Pulses:   2+ and symmetric all  extremities  Skin:   Skin color, texture, turgor normal, no rashes or lesions  Lymph nodes:   Cervical, supraclavicular, and axillary nodes normal  Neurologic:   CNII-XII intact. Normal strength, sensation and reflexes      throughout       Last depression screening scores    08/15/2021    9:57 AM 06/27/2021   10:48 AM 02/08/2020    4:00 PM  PHQ 2/9 Scores  PHQ - 2 Score 0 0 0  PHQ- 9 Score 0 2    Last fall risk screening    08/15/2021    9:57 AM  Fall Risk   Falls in the past year? 0  Number falls in past yr: 0  Injury with Fall? 0  Risk for fall due to : No Fall Risks   Last Audit-C alcohol use screening    12/10/2022   12:16 PM  Alcohol Use Disorder Test (AUDIT)  1. How often do you have a drink containing alcohol? 0   A score of 3 or more in women, and 4 or more in men indicates increased risk for alcohol abuse, EXCEPT if all of the points are from question 1     Assessment & Plan    Routine Health Maintenance and Physical Exam  Exercise Activities and Dietary recommendations  Goals   None     Immunization History  Administered Date(s) Administered   Fluad Quad(high Dose 65+) 04/01/2019   INFLUENZA, HIGH DOSE SEASONAL PF 03/07/2023, 01/03/2024   Moderna Sars-Covid-2 Vaccination 06/15/2019, 07/10/2019, 03/18/2020   PNEUMOCOCCAL CONJUGATE-20 01/03/2024   Tdap 06/17/2015, 07/18/2019    Health Maintenance  Topic Date Due  Medicare Annual Wellness (AWV)  Never done   Zoster Vaccines- Shingrix (1 of 2) Never done   COVID-19 Vaccine (4 - 2025-26 season) 12/16/2023   Colonoscopy  06/30/2025   DTaP/Tdap/Td (3 - Td or Tdap) 07/17/2029   Pneumococcal Vaccine: 50+ Years  Completed   Influenza Vaccine  Completed   Hepatitis C Screening  Completed   HPV VACCINES  Aged Out   Meningococcal B Vaccine  Aged Out    Discussed health benefits of physical activity, and encouraged him to engage in regular exercise appropriate for his age and condition.   2. Prostate  cancer screening  - PSA Total (Reflex To Free)  3. Encounter for special screening examination for cardiovascular disorder  - Comprehensive metabolic panel with GFR - Lipid panel - CBC  4. Varicose veins of both lower extremities with pain  - Ambulatory referral to Vascular Surgery  5. Need for vaccination against Streptococcus pneumoniae  - Pneumococcal conjugate vaccine 20-valent (PCV20)  6. Immunization due  - Flu vaccine HIGH DOSE PF(Fluzone Trivalent)     James Perry, MD  [facility] [phone] (phone) [phone] (fax)  [facility]

## 2024-01-09 ENCOUNTER — Other Ambulatory Visit (INDEPENDENT_AMBULATORY_CARE_PROVIDER_SITE_OTHER): Payer: Self-pay | Admitting: Nurse Practitioner

## 2024-01-09 DIAGNOSIS — I83813 Varicose veins of bilateral lower extremities with pain: Secondary | ICD-10-CM

## 2024-01-10 ENCOUNTER — Ambulatory Visit (INDEPENDENT_AMBULATORY_CARE_PROVIDER_SITE_OTHER)

## 2024-01-10 ENCOUNTER — Ambulatory Visit (INDEPENDENT_AMBULATORY_CARE_PROVIDER_SITE_OTHER): Payer: Self-pay | Admitting: Nurse Practitioner

## 2024-01-10 ENCOUNTER — Encounter (INDEPENDENT_AMBULATORY_CARE_PROVIDER_SITE_OTHER): Payer: Self-pay | Admitting: Vascular Surgery

## 2024-01-10 VITALS — BP 137/80 | HR 62 | Resp 18 | Ht 72.0 in | Wt 196.8 lb

## 2024-01-10 DIAGNOSIS — I83813 Varicose veins of bilateral lower extremities with pain: Secondary | ICD-10-CM

## 2024-01-19 ENCOUNTER — Encounter (INDEPENDENT_AMBULATORY_CARE_PROVIDER_SITE_OTHER): Payer: Self-pay | Admitting: Nurse Practitioner

## 2024-01-19 NOTE — Progress Notes (Signed)
 Subjective:    Patient ID: James Moore, male    DOB: 07/02/1950, 73 y.o.   MRN: [id number] Chief Complaint  Patient presents with   Follow-up    Consult : Gas[place].. LE reflux VV of both lower extremities with pain    The patient is seen for evaluation of symptomatic varicose veins. The patient relates burning and stinging which worsened steadily throughout the course of the day, particularly with standing. The patient also notes an aching and throbbing pain over the varicosities, particularly with prolonged dependent positions. The symptoms are significantly improved with elevation.  The patient also notes that during hot weather the symptoms are greatly intensified. The patient states the pain from the varicose veins interferes with work, daily exercise, shopping and household maintenance. At this point, the symptoms are persistent and severe enough that they're having a negative impact on lifestyle and are interfering with daily activities.  There is no history of DVT, PE or superficial thrombophlebitis. There is no history of ulceration or hemorrhage. The patient denies a significant family history of varicose veins.  The patient wears graduated compression. At the present time the patient has been using over-the-counter analgesics.  The patient has had a previous history of what sounds to be sclerotherapy with some stab phlebectomy done in the right lower extremity.  No evidence of DVT or superficial thrombophlebitis bilaterally.  Today the patient has evidence of reflux bilaterally in the great saphenous veins.    Review of Systems  All other systems reviewed and are negative.      Objective:   Physical Exam Vitals reviewed.  HENT:     Head: Normocephalic.  Cardiovascular:     Rate and Rhythm: Normal rate.  Pulmonary:     Effort: Pulmonary effort is normal.  Musculoskeletal:        General: Tenderness present.  Skin:    General: Skin is warm and dry.  Neurological:      Mental Status: He is alert and oriented to person, place, and time.  Psychiatric:        Mood and Affect: Mood normal.        Behavior: Behavior normal.        Thought Content: Thought content normal.        Judgment: Judgment normal.     BP 137/80   Pulse 62   Resp 18   Ht 6' (1.829 m)   Wt 196 lb 12.8 oz (89.3 kg)   BMI 26.69 kg/m   Past Medical History:  Diagnosis Date   Arthritis    Basal cell carcinoma 09/07/2008   R sideburn within hairline    Basal cell carcinoma 08/29/2023   right clavicle, 10/30/23 pt came to surgery appt and wanted to observe and recheck in 3 months   Cataract    Removed in 2022   Colon polyps    COVID-19 02/2019   Dysplastic nevus 03/28/2010   L lat thigh near knee - mild    Hemorrhoid    prn otc hem.cream   History of MRSA infection    Hypertension    marginal high   Myocardial infarction (HCC) 04-17-19   Covid   Sleep apnea    lost weight so dc'd cpap   Squamous cell carcinoma of skin     Social History   Socioeconomic History   Marital status: Married    Spouse name: Not on file   Number of children: 0   Years of education: Not on file  Highest education level: Bachelor's degree (e.g., BA, AB, BS)  Occupational History   Occupation: Sales  Tobacco Use   Smoking status: Never   Smokeless tobacco: Never  Vaping Use   Vaping status: Never Used  Substance and Sexual Activity   Alcohol use: Not Currently    Alcohol/week: 0.0 standard drinks of alcohol   Drug use: No   Sexual activity: Not on file  Other Topics Concern   Not on file  Social History Narrative   Not on file   Social Drivers of Health   Financial Resource Strain: Low Risk  (12/10/2022)   Overall Financial Resource Strain (CARDIA)    Difficulty of Paying Living Expenses: Not hard at all  Food Insecurity: No Food Insecurity (12/10/2022)   Hunger Vital Sign    Worried About Running Out of Food in the Last Year: Never true    Ran Out of Food in the Last Year:  Never true  Transportation Needs: No Transportation Needs (12/10/2022)   PRAPARE - Administrator, Civil Service (Medical): No    Lack of Transportation (Non-Medical): No  Physical Activity: Insufficiently Active (12/10/2022)   Exercise Vital Sign    Days of Exercise per Week: 2 days    Minutes of Exercise per Session: 30 min  Stress: No Stress Concern Present (12/10/2022)   [employer] of Occupational Health - Occupational Stress Questionnaire    Feeling of Stress : Only a little  Social Connections: Socially Integrated (12/10/2022)   Social Connection and Isolation Panel    Frequency of Communication with Friends and Family: Three times a week    Frequency of Social Gatherings with Friends and Family: Once a week    Attends Religious Services: 1 to 4 times per year    Active Member of Clubs or Organizations: Yes    Attends Engineer, structural: More than 4 times per year    Marital Status: Married  Catering manager Violence: Not on file    Past Surgical History:  Procedure Laterality Date   CATARACT EXTRACTION Left 03/2019   COLONOSCOPY  2007   EYE SURGERY  2022   Cataracts   HERNIA REPAIR  2021   KNEE SURGERY Left    2005-2006- meniscus repair   POLYPECTOMY     UMBILICAL HERNIA REPAIR N/A 03/19/2019   Procedure: HERNIA REPAIR UMBILICAL ADULT;  Surgeon: Desiderio Schanz, MD;  Location: [facility] ORS;  Service: General;  Laterality: N/A;    Family History  Problem Relation Age of Onset   Crohn's disease Mother    Colon polyps Mother    Dementia Father    Colon cancer Neg Hx    Rectal cancer Neg Hx    Stomach cancer Neg Hx     No Known Allergies     Latest Ref Rng & Units 01/03/2024   11:17 AM 08/15/2021   10:28 AM 03/02/2019    5:54 PM  CBC  WBC 3.4 - 10.8 x10E3/uL 6.1  5.7  6.1   Hemoglobin 13.0 - 17.7 g/dL 85.0  84.0  86.5   Hematocrit 37.5 - 51.0 % 45.6  46.3  39.8   Platelets 150 - 450 x10E3/uL 228  237  188       CMP     Component  Value Date/Time   NA 138 01/03/2024 1117   K 4.9 01/03/2024 1117   CL 100 01/03/2024 1117   CO2 23 01/03/2024 1117   GLUCOSE 98 01/03/2024 1117   GLUCOSE 101 (H)  03/02/2019 1754   BUN 16 01/03/2024 1117   CREATININE 1.05 01/03/2024 1117   CREATININE 1.04 03/22/2017 1451   CALCIUM 9.7 01/03/2024 1117   PROT 6.7 01/03/2024 1117   ALBUMIN 4.6 01/03/2024 1117   AST 16 01/03/2024 1117   ALT 14 01/03/2024 1117   ALKPHOS 64 01/03/2024 1117   BILITOT 0.8 01/03/2024 1117   EGFR 76 01/03/2024 1117   GFRNONAA >60 03/02/2019 1754   GFRNONAA 76 03/22/2017 1451     No results found.     Assessment & Plan:   1. Varicose veins of both lower extremities with pain (Primary) Recommend  I have reviewed my  discussion with the patient regarding  varicose veins and why they cause symptoms. Patient will continue  wearing graduated compression stockings class 1 on a daily basis, beginning first thing in the morning and removing them in the evening.  The patient is CEAP [id number].  The patient has been wearing compression for more than 12 weeks with no or little benefit.  The patient has been exercising daily for more than 12 weeks. The patient has been elevating and taking OTC pain medications for more than 12 weeks.  None of these have have eliminated the pain related to the varicose veins and venous reflux or the discomfort regarding venous congestion.    In addition, behavioral modification including elevation during the day was again discussed and this will continue.  The patient has utilized over the counter pain medications and has been exercising.  However, at this time conservative therapy has not alleviated the patient's symptoms of leg pain and swelling  Recommend: laser ablation of the right and  left great saphenous veins to eliminate the symptoms of pain and swelling of the lower extremities caused by the severe superficial venous reflux disease.    Current Outpatient Medications on  File Prior to Visit  Medication Sig Dispense Refill   magnesium oxide (MAG-OX) 400 (240 Mg) MG tablet Take 400 mg by mouth daily. (Patient taking differently: Take 400 mg by mouth daily. As needed)     Multiple Vitamin (MULTIVITAMIN WITH MINERALS) TABS tablet Take 2 tablets by mouth daily.     Omega-3 Fatty Acids (FISH OIL) 300 MG CAPS Take by mouth 1 day or 1 dose. (Patient taking differently: Take by mouth 1 day or 1 dose. As needed)     sildenafil  (REVATIO ) 20 MG tablet Take 1 tablet (20 mg total) by mouth as needed. Take 1-5 tablets one hour prior to intercourse 30 tablet 2   vitamin B-12 (CYANOCOBALAMIN) 50 MCG tablet Take 50 mcg by mouth daily. (Patient taking differently: Take 50 mcg by mouth daily. As needed)     zinc sulfate, 50mg  elemental zinc, 220 (50 Zn) MG capsule Take 220 mg by mouth daily. (Patient taking differently: Take 220 mg by mouth daily. As needed)     tamsulosin  (FLOMAX ) 0.4 MG CAPS capsule Take 1 capsule (0.4 mg total) by mouth daily. (Patient not taking: Reported on 01/10/2024) 90 capsule 0   terbinafine  (LAMISIL ) 250 MG tablet Take by mouth.     No current facility-administered medications on file prior to visit.    There are no Patient Instructions on file for this visit. No follow-ups on file.   Kyree Fedorko E Ata Pecha, NP

## 2024-01-21 ENCOUNTER — Encounter (INDEPENDENT_AMBULATORY_CARE_PROVIDER_SITE_OTHER): Payer: Self-pay

## 2024-01-23 ENCOUNTER — Telehealth (INDEPENDENT_AMBULATORY_CARE_PROVIDER_SITE_OTHER): Payer: Self-pay | Admitting: Vascular Surgery

## 2024-01-23 ENCOUNTER — Other Ambulatory Visit (INDEPENDENT_AMBULATORY_CARE_PROVIDER_SITE_OTHER): Payer: Self-pay

## 2024-01-23 MED ORDER — ALPRAZOLAM 0.5 MG PO TABS: ORAL_TABLET | ORAL | 0 refills | Status: AC

## 2024-01-23 NOTE — Telephone Encounter (Signed)
 Done sent to [facility] in [place] for both procedure dates

## 2024-01-23 NOTE — Telephone Encounter (Signed)
 Patient is scheduled for a right leg GSV laser with Dr. Jama on 11.21.25 and a left leg GSV laser ablation on 12.19.25. Patient will need the standard RX protocol called in to [facility] in [place].

## 2024-01-27 ENCOUNTER — Ambulatory Visit: Admitting: Dermatology

## 2024-01-27 ENCOUNTER — Encounter: Payer: Self-pay | Admitting: Dermatology

## 2024-01-27 DIAGNOSIS — L821 Other seborrheic keratosis: Secondary | ICD-10-CM | POA: Diagnosis not present

## 2024-01-27 DIAGNOSIS — L814 Other melanin hyperpigmentation: Secondary | ICD-10-CM

## 2024-01-27 DIAGNOSIS — I781 Nevus, non-neoplastic: Secondary | ICD-10-CM | POA: Diagnosis not present

## 2024-01-27 DIAGNOSIS — W908XXA Exposure to other nonionizing radiation, initial encounter: Secondary | ICD-10-CM

## 2024-01-27 DIAGNOSIS — D1801 Hemangioma of skin and subcutaneous tissue: Secondary | ICD-10-CM

## 2024-01-27 DIAGNOSIS — Z1283 Encounter for screening for malignant neoplasm of skin: Secondary | ICD-10-CM

## 2024-01-27 DIAGNOSIS — Z85828 Personal history of other malignant neoplasm of skin: Secondary | ICD-10-CM

## 2024-01-27 DIAGNOSIS — D229 Melanocytic nevi, unspecified: Secondary | ICD-10-CM

## 2024-01-27 DIAGNOSIS — L570 Actinic keratosis: Secondary | ICD-10-CM

## 2024-01-27 DIAGNOSIS — Z7189 Other specified counseling: Secondary | ICD-10-CM | POA: Diagnosis not present

## 2024-01-27 DIAGNOSIS — L578 Other skin changes due to chronic exposure to nonionizing radiation: Secondary | ICD-10-CM | POA: Diagnosis not present

## 2024-01-27 MED ORDER — FLUOROURACIL 5 % EX CREA: TOPICAL_CREAM | Freq: Two times a day (BID) | CUTANEOUS | 0 refills | Status: DC

## 2024-01-27 MED ORDER — FLUOROURACIL 5 % EX CREA
TOPICAL_CREAM | Freq: Two times a day (BID) | CUTANEOUS | 0 refills | Status: AC
Start: 2024-01-27 — End: ?

## 2024-01-27 NOTE — Patient Instructions (Addendum)
 - Start 5-fluorouracil cream twice a day until red and irritated to affected areas including right temple, tops of hands, frontal scalp, forehead.  Reviewed course of treatment and expected reaction.  Patient advised to expect inflammation and crusting and advised that erosions are possible.  Patient advised to be diligent with sun protection during and after treatment. Handout with details of how to apply medication and what to expect provided. Counseled to keep medication out of reach of children and pets.  Due to recent changes in healthcare laws, you may see results of your pathology and/or laboratory studies on MyChart before the doctors have had a chance to review them. We understand that in some cases there may be results that are confusing or concerning to you. Please understand that not all results are received at the same time and often the doctors may need to interpret multiple results in order to provide you with the best plan of care or course of treatment. Therefore, we ask that you please give us  2 business days to thoroughly review all your results before contacting the office for clarification. Should we see a critical lab result, you will be contacted sooner.   If You Need Anything After Your Visit  If you have any questions or concerns for your doctor, please call our main line at [phone] and press option 4 to reach your doctor's medical assistant. If no one answers, please leave a voicemail as directed and we will return your call as soon as possible. Messages left after 4 pm will be answered the following business day.   You may also send us  a message via MyChart. We typically respond to MyChart messages within 1-2 business days.  For prescription refills, please ask your pharmacy to contact our office. Our fax number is [phone].  If you have an urgent issue when the clinic is closed that cannot wait until the next business day, you can page your doctor at the number  below.    Please note that while we do our best to be available for urgent issues outside of office hours, we are not available 24/7.   If you have an urgent issue and are unable to reach us , you may choose to seek medical care at your doctor's office, retail clinic, urgent care center, or emergency room.  If you have a medical emergency, please immediately call 911 or go to the emergency department.  Pager Numbers  - Dr. Hester: [phone]  - Dr. Jackquline: [phone]  - Dr. Claudene: [phone]   - Dr. Raymund: [phone]  In the event of inclement weather, please call our main line at [phone] for an update on the status of any delays or closures.  Dermatology Medication Tips: Please keep the boxes that topical medications come in in order to help keep track of the instructions about where and how to use these. Pharmacies typically print the medication instructions only on the boxes and not directly on the medication tubes.   If your medication is too expensive, please contact our office at [phone] option 4 or send us  a message through MyChart.   We are unable to tell what your co-pay for medications will be in advance as this is different depending on your insurance coverage. However, we may be able to find a substitute medication at lower cost or fill out paperwork to get insurance to cover a needed medication.   If a prior authorization is required to get your medication covered by your insurance company, please  allow us  1-2 business days to complete this process.  Drug prices often vary depending on where the prescription is filled and some pharmacies may offer cheaper prices.  The website www.goodrx.com contains coupons for medications through different pharmacies. The prices here do not account for what the cost may be with help from insurance (it may be cheaper with your insurance), but the website can give you the price if you did not use any insurance.  - You  can print the associated coupon and take it with your prescription to the pharmacy.  - You may also stop by our office during regular business hours and pick up a GoodRx coupon card.  - If you need your prescription sent electronically to a different pharmacy, notify our office through [facility] or by phone at [phone] option 4.     Si Usted Necesita Algo Despus de Su Visita  Tambin puede enviarnos un mensaje a travs de Clinical cytogeneticist. Por lo general respondemos a los mensajes de MyChart en el transcurso de 1 a 2 das hbiles.  Para renovar recetas, por favor pida a su farmacia que se ponga en contacto con nuestra oficina. Randi lakes de fax es [place] [phone].  Si tiene un asunto urgente cuando la clnica est cerrada y que no puede esperar hasta el siguiente da hbil, puede llamar/localizar a su doctor(a) al nmero que aparece a continuacin.   Por favor, tenga en cuenta que aunque hacemos todo lo posible para estar disponibles para asuntos urgentes fuera del horario de [place], no estamos disponibles las 24 horas del da, los 7 [address]  [address] de la [place].   Si tiene un problema urgente y no puede comunicarse con nosotros, puede optar por buscar atencin mdica  en el consultorio de su doctor(a), en una clnica privada, en un centro de atencin urgente o en una sala de emergencias.  Si tiene Engineer, drilling, por favor llame inmediatamente al 911 o vaya a la sala de emergencias.  Nmeros de bper  - Dr. Hester: [phone]  - Dra. Jackquline: [phone]  - Dr. Claudene: [phone]  - Dra. Kitts: [phone]  En caso de inclemencias del [place], por favor llame a nuestra lnea principal al [phone] para una actualizacin sobre el estado de cualquier retraso o cierre.  Consejos para la medicacin en dermatologa: Por favor, guarde las cajas en las que vienen los medicamentos de uso tpico para ayudarle a seguir las instrucciones sobre dnde y cmo usarlos. Las  farmacias generalmente imprimen las instrucciones del medicamento slo en las cajas y no directamente en los tubos del [place].   Si su medicamento es muy caro, por favor, pngase en contacto con landry rieger llamando al [phone] y presione la opcin 4 o envenos un mensaje a travs de Clinical cytogeneticist.   No podemos decirle cul ser su copago por los medicamentos por adelantado ya que esto es diferente dependiendo de la cobertura de su seguro. Sin embargo, es posible que podamos encontrar un medicamento sustituto a Audiological scientist un formulario para que el seguro cubra el medicamento que se considera necesario.   Si se requiere una autorizacin previa para que su compaa de seguros malta su medicamento, por favor permtanos de 1 a 2 das hbiles para completar este proceso.  Los precios de los medicamentos varan con frecuencia dependiendo del Environmental consultant de dnde se surte la receta y alguna farmacias pueden ofrecer precios ms baratos.  El sitio web www.goodrx.com tiene cupones para medicamentos de Health and safety inspector. Los precios aqu no  tienen en cuenta lo que podra costar con la ayuda del seguro (puede ser ms barato con su seguro), pero el sitio web puede darle el precio si no Visual merchandiser.  - Puede imprimir el cupn correspondiente y llevarlo con su receta a la farmacia.  - Tambin puede pasar por nuestra oficina durante el horario de atencin regular y Education officer, museum una tarjeta de cupones de GoodRx.  - Si necesita que su receta se enve electrnicamente a una farmacia diferente, informe a nuestra oficina a travs de MyChart de [place] o por telfono llamando al [phone] y presione la opcin 4.

## 2024-01-27 NOTE — Progress Notes (Signed)
 Follow-Up Visit   Subjective  James Moore is a 73 y.o. male who presents for the following: recheck bx proven BCC at right clavicle. Patient advises no symptoms, no tenderness. Patient does have a few other spots he would like checked. A rough and bumpy spot at right temple. Spot at left cheek.  The following portions of the chart were reviewed this encounter and updated as appropriate: medications, allergies, medical history  Review of Systems:  No other skin or systemic complaints except as noted in HPI or Assessment and Plan.  Objective  Well appearing patient in no apparent distress; mood and affect are within normal limits.    A focused examination was performed of the following areas: Face, chest, back, abdomen, arms, hands  Relevant exam findings are noted in the Assessment and Plan.  temples, forehead, frontal scalp, dorsal hands Erythematous thin papules/macules with gritty scale.   Assessment & Plan   HISTORY OF BASAL CELL CARCINOMA OF THE SKIN - No evidence of recurrence today at right clavicle - Recommend regular full body skin exams - Recommend daily broad spectrum sunscreen SPF 30+ to sun-exposed areas, reapply every 2 hours as needed.  - Call if any new or changing lesions are noted between office visits  LENTIGINES, SEBORRHEIC KERATOSES, HEMANGIOMAS - Benign normal skin lesions - Benign-appearing - Call for any changes  MELANOCYTIC NEVI - Tan-brown and/or pink-flesh-colored symmetric macules and papules - Benign appearing on exam today - Observation - Call clinic for new or changing moles - Recommend daily use of broad spectrum spf 30+ sunscreen to sun-exposed areas.   ACTINIC DAMAGE - Chronic condition, secondary to cumulative UV/sun exposure - diffuse scaly erythematous macules with underlying dyspigmentation - Recommend daily broad spectrum sunscreen SPF 30+ to sun-exposed areas, reapply every 2 hours as needed.  - Staying in the shade or wearing  long sleeves, sun glasses (UVA+UVB protection) and wide brim hats (4-inch brim around the entire circumference of the hat) are also recommended for sun protection.  - Call for new or changing lesions.  SKIN CANCER SCREENING PERFORMED TODAY   AK (ACTINIC KERATOSIS) temples, forehead, frontal scalp, dorsal hands Actinic keratoses are precancerous spots that appear secondary to cumulative UV radiation exposure/sun exposure over time. They are chronic with expected duration over 1 year. A portion of actinic keratoses will progress to squamous cell carcinoma of the skin. It is not possible to reliably predict which spots will progress to skin cancer and so treatment is recommended to prevent development of skin cancer.  Recommend daily broad spectrum sunscreen SPF 30+ to sun-exposed areas, reapply every 2 hours as needed.  Recommend staying in the shade or wearing long sleeves, sun glasses (UVA+UVB protection) and wide brim hats (4-inch brim around the entire circumference of the hat). Call for new or changing lesions. Related Medications fluorouracil (EFUDEX) 5 % cream Apply topically 2 (two) times daily. To right temple until red and irritated, then stop. May also treat left temple, forehead, frontal scalp, backs of hands. Treat one area at a time. LENTIGINES   MULTIPLE BENIGN NEVI   ACTINIC ELASTOSIS   SEBORRHEIC KERATOSES   CHERRY ANGIOMA   TELANGIECTASIA    ACTINIC DAMAGE WITH PRECANCEROUS ACTINIC KERATOSES Counseling for Topical Chemotherapy Management: Patient exhibits: - Severe, confluent actinic changes with pre-cancerous actinic keratoses that is secondary to cumulative UV radiation exposure over time - Condition that is severe; chronic, not at goal. - diffuse scaly erythematous macules and papules with underlying dyspigmentation - Discussed Prescription  Field Treatment topical Chemotherapy for Severe, Chronic Confluent Actinic Changes with Pre-Cancerous Actinic  Keratoses Field treatment involves treatment of an entire area of skin that has confluent Actinic Changes (Sun/ Ultraviolet light damage) and PreCancerous Actinic Keratoses by method of PhotoDynamic Therapy (PDT) and/or prescription Topical Chemotherapy agents such as 5-fluorouracil, 5-fluorouracil/calcipotriene, and/or imiquimod.  The purpose is to decrease the number of clinically evident and subclinical PreCancerous lesions to prevent progression to development of skin cancer by chemically destroying early precancer changes that may or may not be visible.  It has been shown to reduce the risk of developing skin cancer in the treated area. As a result of treatment, redness, scaling, crusting, and open sores may occur during treatment course. One or more than one of these methods may be used and may have to be used several times to control, suppress and eliminate the PreCancerous changes. Discussed treatment course, expected reaction, and possible side effects. - Recommend daily broad spectrum sunscreen SPF 30+ to sun-exposed areas, reapply every 2 hours as needed.  - Staying in the shade or wearing long sleeves, sun glasses (UVA+UVB protection) and wide brim hats (4-inch brim around the entire circumference of the hat) are also recommended. - Call for new or changing lesions. - Start 5-fluorouracil cream twice a day until red and irritated to affected areas including right temple, tops of hands, frontal scalp, forehead.  Reviewed course of treatment and expected reaction.  Patient advised to expect inflammation and crusting and advised that erosions are possible.  Patient advised to be diligent with sun protection during and after treatment. Handout with details of how to apply medication and what to expect provided. Counseled to keep medication out of reach of children and pets.  TELANGIECTASIA Exam: dilated blood vessel(s) on nasal tip and dorsum  Treatment Plan: Benign appearing on exam Call for  changes  Counseling for BBL / IPL / Laser and Coordination of Care Discussed the treatment option of Broad Band Light (BBL) /Intense Pulsed Light (IPL)/ Laser for skin discoloration, including brown spots and redness.  Typically we recommend at least 1-3 treatment sessions about 5-8 weeks apart for best results.  Cannot have tanned skin when BBL performed, and regular use of sunscreen/photoprotection is advised after the procedure to help maintain results. The patient's condition may also require maintenance treatments in the future.  The fee for BBL / laser treatments is $350 per treatment session for the whole face.  A fee can be quoted for other parts of the body.  Insurance typically does not pay for BBL/laser treatments and therefore the fee is an out-of-pocket cost. Recommend prophylactic valtrex treatment. Once scheduled for procedure, will send Rx in prior to patient's appointment.    Return for TBSE, as scheduled, with Dr. Claudene, [facility], Hx[facility], HxDN.  LILLETTE Lonell Drones, RMA, am acting as scribe for Boneta Claudene, MD .   Documentation: I have reviewed the above documentation for accuracy and completeness, and I agree with the above.  Boneta Claudene, MD

## 2024-02-04 ENCOUNTER — Encounter: Payer: Self-pay | Admitting: Family Medicine

## 2024-02-04 ENCOUNTER — Other Ambulatory Visit: Payer: Self-pay | Admitting: Family Medicine

## 2024-02-04 DIAGNOSIS — R972 Elevated prostate specific antigen [PSA]: Secondary | ICD-10-CM

## 2024-02-07 LAB — PSA: Prostate Specific Ag, Serum: 5.5 ng/mL — ABNORMAL HIGH (ref 0.0–4.0)

## 2024-02-09 ENCOUNTER — Ambulatory Visit: Payer: Self-pay | Admitting: Family Medicine

## 2024-02-09 DIAGNOSIS — N529 Male erectile dysfunction, unspecified: Secondary | ICD-10-CM

## 2024-02-16 IMAGING — CR DG CHEST 2V
1 series · 2 of 2 positions shown · non-contrast
Comparison: Chest two views 02/08/2020

CLINICAL DATA: Cough for 3 weeks.

EXAM:
CHEST - 2 VIEW

[Series 1: dg chest 2 view · 0.14mm/px · 2 of 2 slices shown]
[im 1/2]
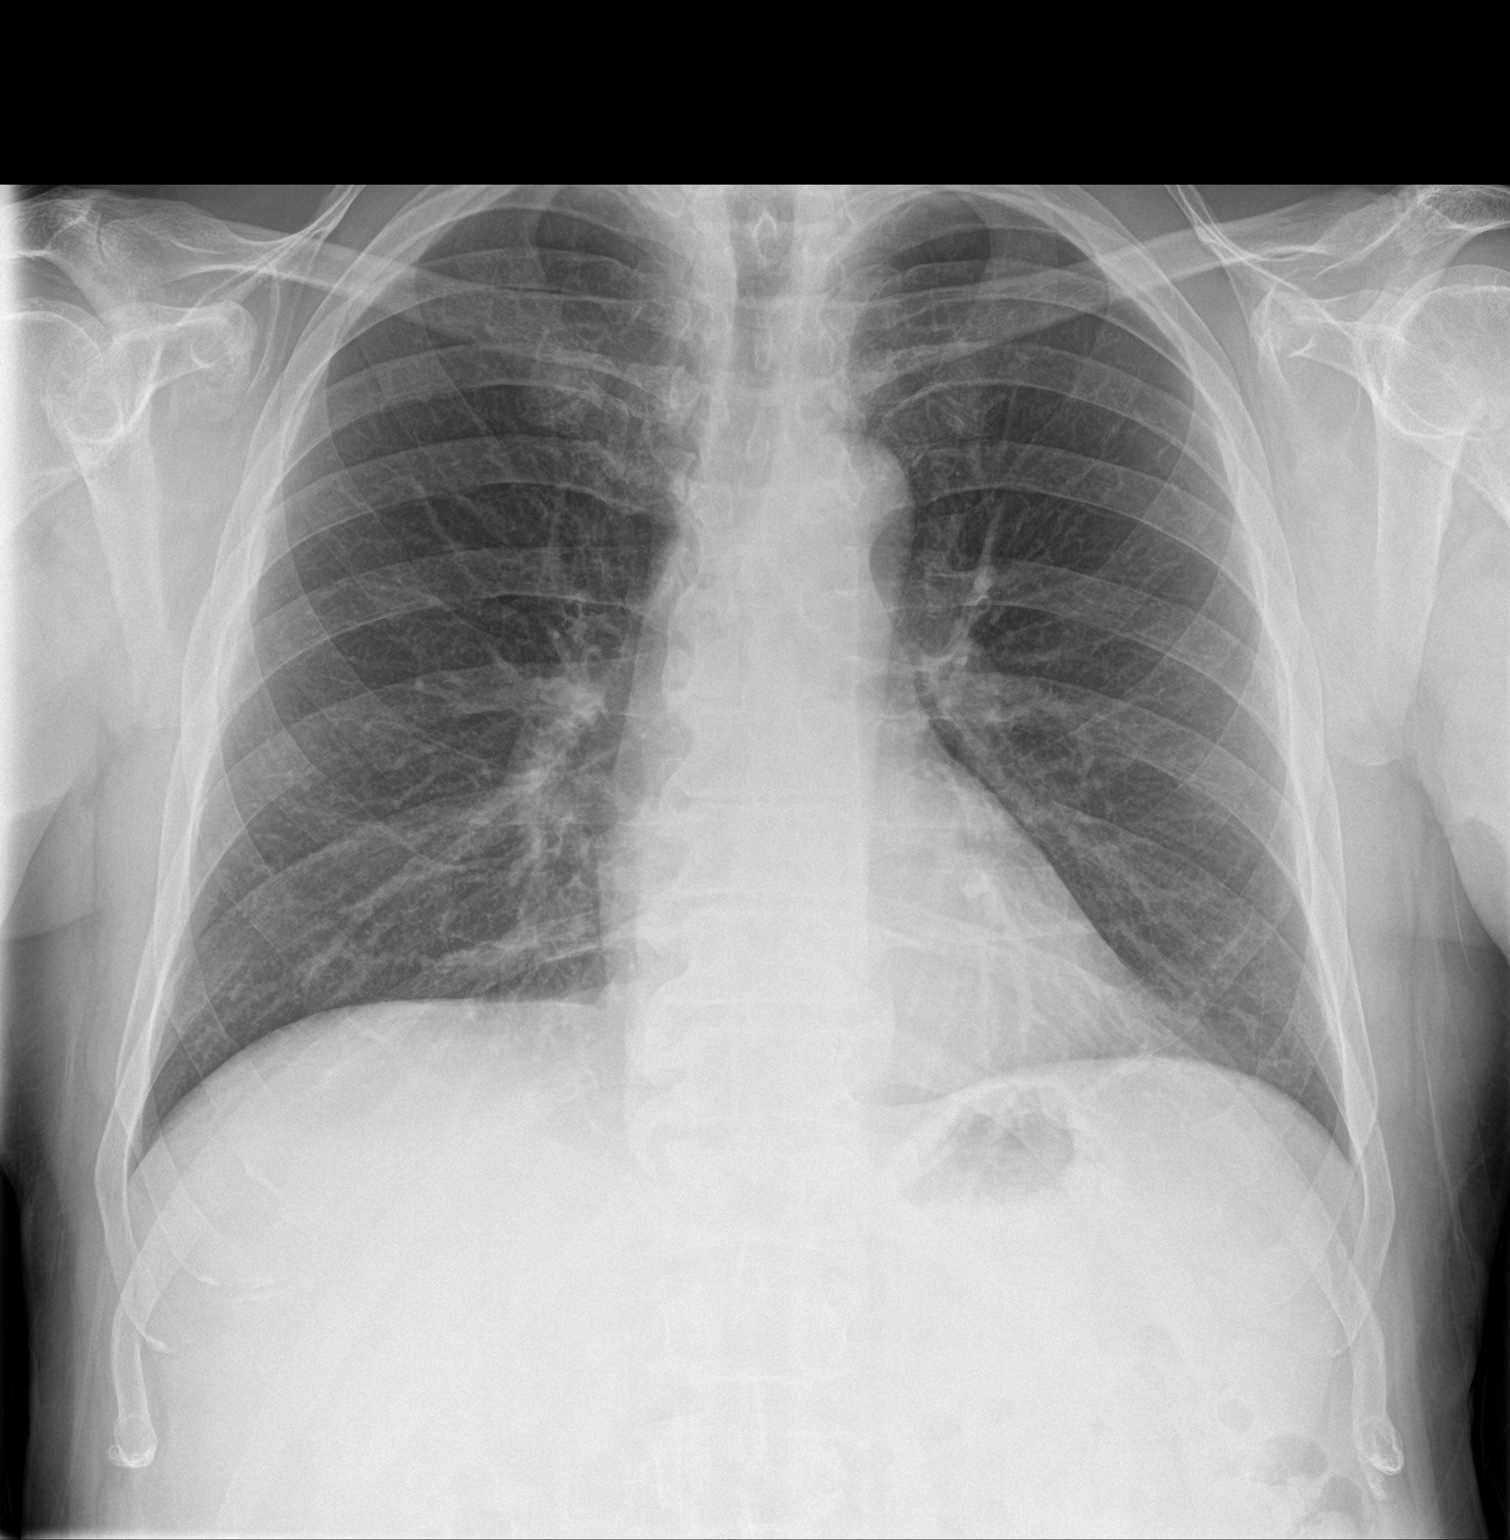
[im 2/2]
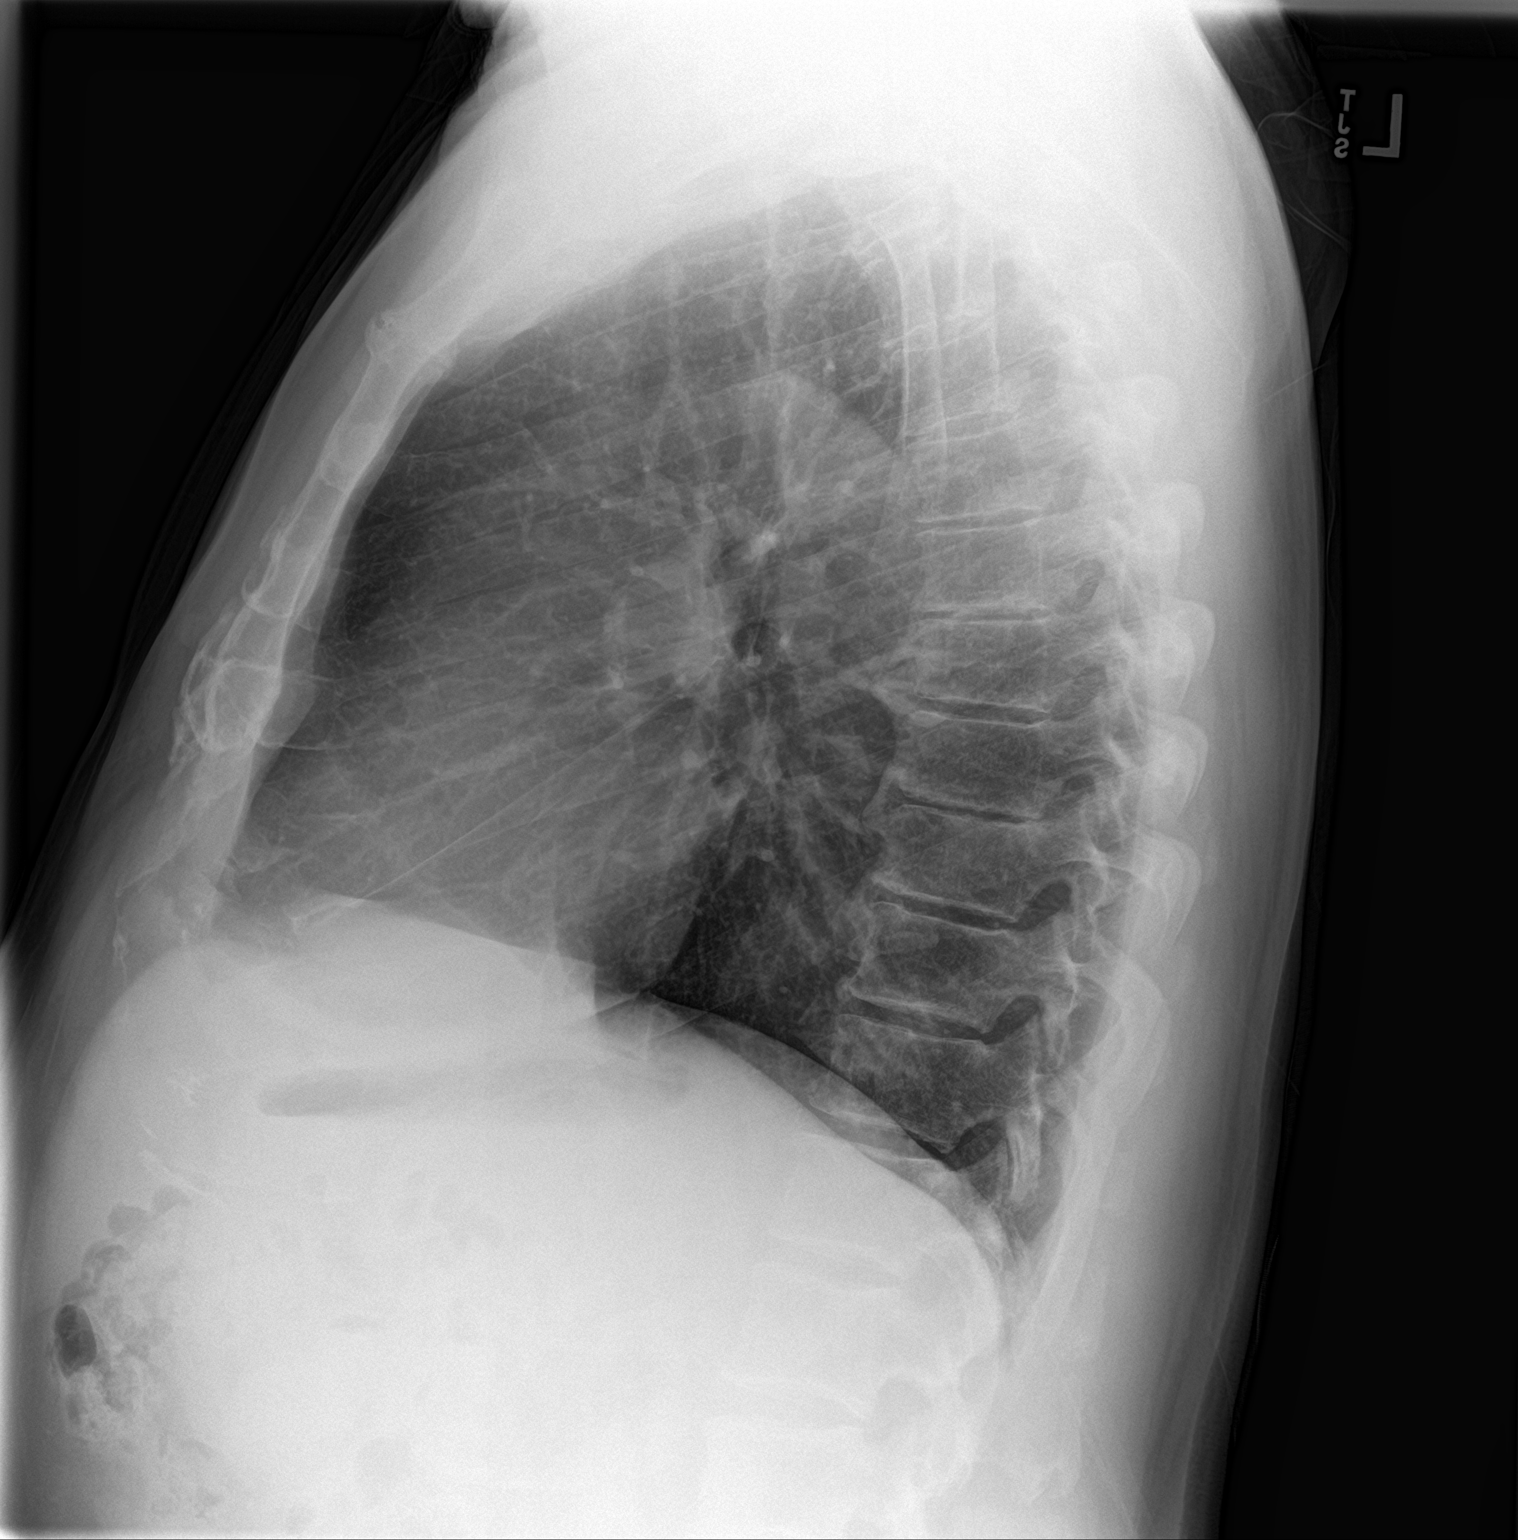

[2 of 2 positions shown; findings below may reference images not displayed]

FINDINGS: Cardiac silhouette and mediastinal contours are within normal
limits. The lungs are clear. No pleural effusion or pneumothorax.
Moderate multilevel degenerative bridging osteophytes throughout the
thoracic spine with mild-to-moderate multilevel disc space
narrowing.
IMPRESSION: No active cardiopulmonary disease.

## 2024-02-21 MED ORDER — SILDENAFIL CITRATE 20 MG PO TABS: 20.0000 mg | ORAL_TABLET | ORAL | 2 refills | Status: AC | PRN

## 2024-02-24 ENCOUNTER — Telehealth: Payer: Self-pay

## 2024-02-24 ENCOUNTER — Other Ambulatory Visit (HOSPITAL_COMMUNITY): Payer: Self-pay

## 2024-02-24 NOTE — Telephone Encounter (Signed)
 Pharmacy Patient Advocate Encounter   Received notification from Onbase that prior authorization for Sildenafil  Citrate (PAH) 20MG  tablets  is required/requested.   Insurance verification completed.   The patient is insured through [employer].   Per test claim: PA required; PA submitted to above mentioned insurance via Latent Key/confirmation #/EOC [id number] Status is pending

## 2024-02-25 NOTE — Telephone Encounter (Signed)
 Pharmacy Patient Advocate Encounter  Received notification from [facility] that Prior Authorization for Sildenafil  Citrate (PAH) 20MG  tablets  has been DENIED.  See denial reason below. No denial letter attached in CMM. Will attach denial letter to Media tab once received.   PA #/Case ID/Reference #: [id number]

## 2024-02-28 NOTE — Telephone Encounter (Signed)
 Advised

## 2024-03-06 ENCOUNTER — Ambulatory Visit (INDEPENDENT_AMBULATORY_CARE_PROVIDER_SITE_OTHER): Admitting: Vascular Surgery

## 2024-03-06 VITALS — BP 150/89 | HR 67 | Wt 197.2 lb

## 2024-03-06 DIAGNOSIS — I83811 Varicose veins of right lower extremities with pain: Secondary | ICD-10-CM | POA: Diagnosis not present

## 2024-03-06 NOTE — Progress Notes (Signed)
 James Moore is a 73 y.o. male who presents with symptomatic venous reflux  Past Medical History:  Diagnosis Date   Arthritis    Basal cell carcinoma 09/07/2008   R sideburn within hairline    Basal cell carcinoma 08/29/2023   right clavicle, 10/30/23 pt came to surgery appt and wanted to observe and recheck in 3 months   Cataract    Removed in 2022   Colon polyps    COVID-19 02/2019   Dysplastic nevus 03/28/2010   L lat thigh near knee - mild    Hemorrhoid    prn otc hem.cream   History of MRSA infection    Hypertension    marginal high   Myocardial infarction (HCC) 04-17-19   Covid   Sleep apnea    lost weight so dc'd cpap   Squamous cell carcinoma of skin     Past Surgical History:  Procedure Laterality Date   CATARACT EXTRACTION Left 03/2019   COLONOSCOPY  2007   EYE SURGERY  2022   Cataracts   HERNIA REPAIR  2021   KNEE SURGERY Left    2005-2006- meniscus repair   POLYPECTOMY     UMBILICAL HERNIA REPAIR N/A 03/19/2019   Procedure: HERNIA REPAIR UMBILICAL ADULT;  Surgeon: Desiderio Schanz, MD;  Location: [facility] ORS;  Service: General;  Laterality: N/A;     Current Outpatient Medications:    ALPRAZolam  (XANAX ) 0.5 MG tablet, Take 1 tab one hour prior to procedure and the 2nd tab when you arrive at our office, Disp: 2 tablet, Rfl: 0   [START ON 04/03/2024] ALPRAZolam  (XANAX ) 0.5 MG tablet, Take 1 tab one hour prior to procedure and the 2nd tab when you arrive at our office, Disp: 2 tablet, Rfl: 0   fluorouracil  (EFUDEX ) 5 % cream, Apply topically 2 (two) times daily. To right temple until red and irritated, then stop. May also treat left temple, forehead, frontal scalp, backs of hands. Treat one area at a time., Disp: 40 g, Rfl: 0   magnesium oxide (MAG-OX) 400 (240 Mg) MG tablet, Take 400 mg by mouth daily. (Patient taking differently: Take 400 mg by mouth daily. As needed), Disp: , Rfl:    Multiple Vitamin (MULTIVITAMIN WITH MINERALS) TABS tablet, Take 2 tablets by  mouth daily., Disp: , Rfl:    Omega-3 Fatty Acids (FISH OIL) 300 MG CAPS, Take by mouth 1 day or 1 dose. (Patient taking differently: Take by mouth 1 day or 1 dose. As needed), Disp: , Rfl:    sildenafil  (REVATIO ) 20 MG tablet, Take 1 tablet (20 mg total) by mouth as needed. Take 1-5 tablets one hour prior to intercourse, Disp: 30 tablet, Rfl: 2   tamsulosin  (FLOMAX ) 0.4 MG CAPS capsule, Take 1 capsule (0.4 mg total) by mouth daily. (Patient not taking: Reported on 01/10/2024), Disp: 90 capsule, Rfl: 0   vitamin B-12 (CYANOCOBALAMIN) 50 MCG tablet, Take 50 mcg by mouth daily. (Patient taking differently: Take 50 mcg by mouth daily. As needed), Disp: , Rfl:    zinc sulfate, 50mg  elemental zinc, 220 (50 Zn) MG capsule, Take 220 mg by mouth daily. (Patient taking differently: Take 220 mg by mouth daily. As needed), Disp: , Rfl:   No Known Allergies   Varicose veins of leg with pain, right     PLAN: The patient's right lower extremity was sterilely prepped and draped. The ultrasound machine was used to visualize the saphenous vein throughout its course. A segment in the area just at and  below the knee was selected for access. The saphenous vein was accessed without difficulty using ultrasound guidance with a micropuncture needle. A 0.018 wire was then placed beyond the saphenofemoral junction and the needle was removed. The 65 cm sheath was then placed over the wire and the wire and dilator were removed. The laser fiber was then placed through the sheath and its tip was placed approximately 5 centimeters below the saphenofemoral junction. Tumescent anesthesia was then created with a dilute lidocaine  solution. Laser energy was then delivered with constant withdrawal of the sheath and laser fiber. Approximately 1470 joules of energy were delivered over a length of 31 centimeters using a 1470 Hz VenaCure machine at 7 W. Sterile dressings were placed. The patient tolerated the procedure well without obvious  complications.   Follow-up in 1 week with post-laser duplex.

## 2024-03-11 ENCOUNTER — Ambulatory Visit (INDEPENDENT_AMBULATORY_CARE_PROVIDER_SITE_OTHER)

## 2024-03-11 DIAGNOSIS — I83811 Varicose veins of right lower extremities with pain: Secondary | ICD-10-CM | POA: Diagnosis not present

## 2024-03-23 NOTE — Telephone Encounter (Signed)
 I spoke with him and what is he is describing is normal post laser. He will keep his original followup

## 2024-04-02 ENCOUNTER — Telehealth (INDEPENDENT_AMBULATORY_CARE_PROVIDER_SITE_OTHER): Payer: Self-pay

## 2024-04-02 NOTE — Telephone Encounter (Signed)
 Returned call to patient in reference to his Xanax  prescription, advised patient that medication is available for fill for him at the pharmacy he may coordinate pickup, no further questions at this time.

## 2024-04-03 ENCOUNTER — Encounter (INDEPENDENT_AMBULATORY_CARE_PROVIDER_SITE_OTHER): Payer: Self-pay | Admitting: Vascular Surgery

## 2024-04-03 ENCOUNTER — Ambulatory Visit (INDEPENDENT_AMBULATORY_CARE_PROVIDER_SITE_OTHER): Admitting: Vascular Surgery

## 2024-04-03 VITALS — BP 149/81 | HR 76 | Resp 17 | Ht 72.0 in | Wt 195.4 lb

## 2024-04-03 DIAGNOSIS — I83812 Varicose veins of left lower extremities with pain: Secondary | ICD-10-CM | POA: Insufficient documentation

## 2024-04-03 NOTE — Progress Notes (Signed)
 " James Moore is a 73 y.o. male who presents with symptomatic venous reflux  Past Medical History:  Diagnosis Date   Arthritis    Basal cell carcinoma 09/07/2008   R sideburn within hairline    Basal cell carcinoma 08/29/2023   right clavicle, 10/30/23 pt came to surgery appt and wanted to observe and recheck in 3 months   Cataract    Removed in 2022   Colon polyps    COVID-19 02/2019   Dysplastic nevus 03/28/2010   L lat thigh near knee - mild    Hemorrhoid    prn otc hem.cream   History of MRSA infection    Hypertension    marginal high   Myocardial infarction (HCC) 04-17-19   Covid   Sleep apnea    lost weight so dc'd cpap   Squamous cell carcinoma of skin     Past Surgical History:  Procedure Laterality Date   CATARACT EXTRACTION Left 03/2019   COLONOSCOPY  2007   EYE SURGERY  2022   Cataracts   HERNIA REPAIR  2021   KNEE SURGERY Left    2005-2006- meniscus repair   POLYPECTOMY     UMBILICAL HERNIA REPAIR N/A 03/19/2019   Procedure: HERNIA REPAIR UMBILICAL ADULT;  Surgeon: Desiderio Schanz, MD;  Location: [facility] ORS;  Service: General;  Laterality: N/A;    Current Medications[1]  Allergies[2]   Varicose veins of leg with pain, left   PLAN: The patient's left lower extremity was sterilely prepped and draped. The ultrasound machine was used to visualize the saphenous vein throughout its course. A segment in the upper calf was selected for access. The saphenous vein was accessed without difficulty using ultrasound guidance with a micropuncture needle. A 0.018 wire was then placed beyond the saphenofemoral junction and the needle was removed. The 65 cm sheath was then placed over the wire and the wire and dilator were removed. The laser fiber was then placed through the sheath and its tip was placed approximately 5 centimeters below the saphenofemoral junction. Tumescent anesthesia was then created with a dilute lidocaine  solution. Laser energy was then delivered with  constant withdrawal of the sheath and laser fiber. Approximately 1534 joules of energy were delivered over a length of 33 centimeters using a 1470 Hz VenaCure machine at 7 W. Sterile dressings were placed. The patient tolerated the procedure well without obvious complications.   Follow-up in 1 week with post-laser duplex.     [1]  Current Outpatient Medications:    ALPRAZolam  (XANAX ) 0.5 MG tablet, Take 1 tab one hour prior to procedure and the 2nd tab when you arrive at our office, Disp: 2 tablet, Rfl: 0   ALPRAZolam  (XANAX ) 0.5 MG tablet, Take 1 tab one hour prior to procedure and the 2nd tab when you arrive at our office, Disp: 2 tablet, Rfl: 0   fluorouracil  (EFUDEX ) 5 % cream, Apply topically 2 (two) times daily. To right temple until red and irritated, then stop. May also treat left temple, forehead, frontal scalp, backs of hands. Treat one area at a time., Disp: 40 g, Rfl: 0   magnesium oxide (MAG-OX) 400 (240 Mg) MG tablet, Take 400 mg by mouth daily. (Patient taking differently: Take 400 mg by mouth daily. As needed), Disp: , Rfl:    Multiple Vitamin (MULTIVITAMIN WITH MINERALS) TABS tablet, Take 2 tablets by mouth daily., Disp: , Rfl:    Omega-3 Fatty Acids (FISH OIL) 300 MG CAPS, Take by mouth 1 day or 1  dose. (Patient taking differently: Take by mouth 1 day or 1 dose. As needed), Disp: , Rfl:    sildenafil  (REVATIO ) 20 MG tablet, Take 1 tablet (20 mg total) by mouth as needed. Take 1-5 tablets one hour prior to intercourse, Disp: 30 tablet, Rfl: 2   tamsulosin  (FLOMAX ) 0.4 MG CAPS capsule, Take 1 capsule (0.4 mg total) by mouth daily. (Patient not taking: Reported on 01/10/2024), Disp: 90 capsule, Rfl: 0   vitamin B-12 (CYANOCOBALAMIN) 50 MCG tablet, Take 50 mcg by mouth daily. (Patient taking differently: Take 50 mcg by mouth daily. As needed), Disp: , Rfl:    zinc sulfate, 50mg  elemental zinc, 220 (50 Zn) MG capsule, Take 220 mg by mouth daily. (Patient taking differently: Take 220 mg by  mouth daily. As needed), Disp: , Rfl:  [2] No Known Allergies  "

## 2024-04-03 NOTE — Assessment & Plan Note (Signed)
 SABRA

## 2024-04-06 ENCOUNTER — Other Ambulatory Visit (INDEPENDENT_AMBULATORY_CARE_PROVIDER_SITE_OTHER): Payer: Self-pay | Admitting: Vascular Surgery

## 2024-04-06 DIAGNOSIS — I83812 Varicose veins of left lower extremities with pain: Secondary | ICD-10-CM

## 2024-04-13 ENCOUNTER — Ambulatory Visit (INDEPENDENT_AMBULATORY_CARE_PROVIDER_SITE_OTHER)

## 2024-04-13 DIAGNOSIS — I83812 Varicose veins of left lower extremities with pain: Secondary | ICD-10-CM | POA: Diagnosis not present

## 2024-04-29 ENCOUNTER — Other Ambulatory Visit: Payer: Self-pay

## 2024-04-29 NOTE — Progress Notes (Signed)
 Pre employment drug screen completed and cleared after consents signed for [employer].

## 2024-05-01 ENCOUNTER — Ambulatory Visit (INDEPENDENT_AMBULATORY_CARE_PROVIDER_SITE_OTHER): Admitting: Vascular Surgery

## 2024-05-07 ENCOUNTER — Ambulatory Visit (INDEPENDENT_AMBULATORY_CARE_PROVIDER_SITE_OTHER): Admitting: Nurse Practitioner

## 2024-05-07 ENCOUNTER — Encounter (INDEPENDENT_AMBULATORY_CARE_PROVIDER_SITE_OTHER): Payer: Self-pay | Admitting: Nurse Practitioner

## 2024-05-07 VITALS — BP 128/78 | HR 64 | Resp 18 | Ht 73.0 in | Wt 194.8 lb

## 2024-05-07 DIAGNOSIS — I83812 Varicose veins of left lower extremities with pain: Secondary | ICD-10-CM

## 2024-05-07 DIAGNOSIS — I739 Peripheral vascular disease, unspecified: Secondary | ICD-10-CM | POA: Diagnosis not present

## 2024-05-07 DIAGNOSIS — I83893 Varicose veins of bilateral lower extremities with other complications: Secondary | ICD-10-CM

## 2024-05-07 DIAGNOSIS — I83811 Varicose veins of right lower extremities with pain: Secondary | ICD-10-CM

## 2024-05-10 ENCOUNTER — Encounter (INDEPENDENT_AMBULATORY_CARE_PROVIDER_SITE_OTHER): Payer: Self-pay | Admitting: Nurse Practitioner

## 2024-05-10 NOTE — Progress Notes (Signed)
 "  Subjective:    Patient ID: James Moore, male    DOB: 1951/01/16, 74 y.o.   MRN: [id number] Chief Complaint  Patient presents with   Follow-up    Follow up 4 & 8 week post laser bilateral GSV    HPI  Discussed the use of AI scribe software for clinical note transcription with the patient, who gave verbal consent to proceed.  History of Present Illness James Moore is a 74 year old male with chronic venous insufficiency and prior bilateral saphenous vein ablation who presents with persistent bilateral lower extremity pain and paresthesia.  He reports ongoing tingling, burning, and creepy crawling sensations in both lower extremities, most pronounced along the anterior and lateral shins. Symptoms are present at rest, worsen when lying down at night, and are accompanied by intermittent hot and cold sensations in the feet. He describes significant tightness and constriction around the ankles and along the muscle from the ankle up the leg, as well as tightness on the plantar surfaces of the feet.  Symptoms are exacerbated by activity, including walking, racquetball, pickleball, golf, and swimming, and limit his mobility. He obtains transient relief with a Theragun massager, which provides improvement for a few hours. He has used 15-20 mmHg compression stockings but requests lighter, more comfortable options.  He has undergone bilateral saphenous vein ablation and treatment of a small varicose vein. Despite these interventions, symptoms have persisted. He has not had prior arterial evaluation of the lower extremities. Family history is notable for siblings with varicose veins who have also undergone vein procedures.  He also describes morning lower back tightness and discomfort, with sciatica-like symptoms and muscle tightness near the kidney area, which improve throughout the day. 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    Results Diagnostic Lower extremity venous ultrasound: Saphenous veins occluded; procedure successful; prior venous reflux documented before occlusion   Review of Systems  Cardiovascular:  Negative for leg swelling.  Musculoskeletal:        Sciatic pain  Neurological:        Parasthesias  All other systems reviewed and are negative.      Objective:   Physical Exam Vitals reviewed.  HENT:     Head: Normocephalic.  Cardiovascular:     Rate and Rhythm: Normal rate.     Pulses:          Dorsalis pedis pulses are 1+ on the right side and 1+ on the left side.  Pulmonary:     Effort: Pulmonary effort is normal.  Musculoskeletal:     Right lower leg: No edema.     Left lower leg: No edema.  Skin:    General: Skin is warm and dry.  Neurological:     Mental Status: He is alert and oriented to person, place, and time.  Psychiatric:        Mood and Affect: Mood normal.        Behavior: Behavior normal.        Thought Content: Thought content normal.        Judgment: Judgment normal.     Physical Exam CARDIOVASCULAR: Pulse palpable, moderate strength.  BP 128/78 (BP Location: Left Arm, Patient Position: Sitting, Cuff Size: Normal)   Pulse 64   Resp 18   Ht 6' 1 (1.854 m)   Wt 194 lb 12.8 oz (88.4 kg)   BMI 25.70 kg/m  Past Medical History:  Diagnosis Date   Arthritis    Basal cell carcinoma 09/07/2008   R sideburn within hairline    Basal cell carcinoma 08/29/2023   right clavicle, 10/30/23 pt came to surgery appt and wanted to observe and recheck in 3 months   Cataract    Removed in 2022   Colon polyps    COVID-19 02/2019   Dysplastic nevus 03/28/2010   L lat thigh near knee - mild    Hemorrhoid    prn otc hem.cream   History of MRSA infection    Hypertension    marginal high   Myocardial infarction (HCC) 04-17-19   Covid   Sleep apnea    lost weight so dc'd cpap   Squamous cell carcinoma of skin     Social  History   Socioeconomic History   Marital status: Married    Spouse name: Not on file   Number of children: 0   Years of education: Not on file   Highest education level: Bachelor's degree (e.g., BA, AB, BS)  Occupational History   Occupation: Sales  Tobacco Use   Smoking status: Never   Smokeless tobacco: Never  Vaping Use   Vaping status: Never Used  Substance and Sexual Activity   Alcohol use: Not Currently    Alcohol/week: 0.0 standard drinks of alcohol   Drug use: No   Sexual activity: Not on file  Other Topics Concern   Not on file  Social History Narrative   Not on file   Social Drivers of Health   Tobacco Use: Low Risk (05/10/2024)   Patient History    Smoking Tobacco Use: Never    Smokeless Tobacco Use: Never    Passive Exposure: Not on file  Financial Resource Strain: Low Risk (12/10/2022)   Overall Financial Resource Strain (CARDIA)    Difficulty of Paying Living Expenses: Not hard at all  Food Insecurity: No Food Insecurity (12/10/2022)   Hunger Vital Sign    Worried About Running Out of Food in the Last Year: Never true    Ran Out of Food in the Last Year: Never true  Transportation Needs: No Transportation Needs (12/10/2022)   PRAPARE - Administrator, Civil Service (Medical): No    Lack of Transportation (Non-Medical): No  Physical Activity: Insufficiently Active (12/10/2022)   Exercise Vital Sign    Days of Exercise per Week: 2 days    Minutes of Exercise per Session: 30 min  Stress: No Stress Concern Present (12/10/2022)   [employer] of Occupational Health - Occupational Stress Questionnaire    Feeling of Stress : Only a little  Social Connections: Socially Integrated (12/10/2022)   Social Connection and Isolation Panel    Frequency of Communication with Friends and Family: Three times a week    Frequency of Social Gatherings with Friends and Family: Once a week    Attends Religious Services: 1 to 4 times per year    Active Member of  [employer] or Organizations: Yes    Attends Banker Meetings: More than 4 times per year    Marital Status: Married  Catering Manager Violence: Not on file  Depression (PHQ2-9): Low Risk (08/15/2021)   Depression (PHQ2-9)    PHQ-2 Score: 0  Alcohol Screen: Not on file  Housing: Low Risk (12/10/2022)   Housing    Last Housing Risk Score: 0  Utilities: Not on file  Health Literacy: Not on file    Past Surgical History:  Procedure Laterality Date   CATARACT EXTRACTION Left 03/2019   COLONOSCOPY  2007   EYE SURGERY  2022   Cataracts   HERNIA REPAIR  2021   KNEE SURGERY Left    2005-2006- meniscus repair   POLYPECTOMY     UMBILICAL HERNIA REPAIR N/A 03/19/2019   Procedure: HERNIA REPAIR UMBILICAL ADULT;  Surgeon: Desiderio Schanz, MD;  Location: [facility] OR[facility];  Service: General;  Laterality: N/A;    Family History  Problem Relation Age of Onset   Crohn's disease Mother    Colon polyps Mother    Dementia Father    Colon cancer Neg Hx    Rectal cancer Neg Hx    Stomach cancer Neg Hx     Allergies[1]     Latest Ref Rng & Units 01/03/2024   11:17 AM 08/15/2021   10:28 AM 03/02/2019    5:54 PM  CBC  WBC 3.4 - 10.8 x10E3/uL 6.1  5.7  6.1   Hemoglobin 13.0 - 17.7 g/dL 85.0  84.0  86.5   Hematocrit 37.5 - 51.0 % 45.6  46.3  39.8   Platelets 150 - 450 x10E3/uL 228  237  188       CMP     Component Value Date/Time   NA 138 01/03/2024 1117   K 4.9 01/03/2024 1117   CL 100 01/03/2024 1117   CO2 23 01/03/2024 1117   GLUCOSE 98 01/03/2024 1117   GLUCOSE 101 (H) 03/02/2019 1754   BUN 16 01/03/2024 1117   CREATININE 1.05 01/03/2024 1117   CREATININE 1.04 03/22/2017 1451   CALCIUM 9.7 01/03/2024 1117   PROT 6.7 01/03/2024 1117   ALBUMIN 4.6 01/03/2024 1117   AST 16 01/03/2024 1117   ALT 14 01/03/2024 1117   ALKPHOS 64 01/03/2024 1117   BILITOT 0.8 01/03/2024 1117   EGFR 76 01/03/2024 1117   GFRNONAA >60 03/02/2019 1754   GFRNONAA 76 03/22/2017 1451     No results  found.     Assessment & Plan:   1. Varicose veins of bilateral lower extremities with other complications (Primary) Varicose veins of both lower extremities with pain Persistent pain, paresthesia, and constriction despite prior venous procedures suggest possible neuropathic involvement or restless leg syndrome. Further ablation contraindicated due to risks and limited efficacy. - Prescribed lighter weight, nylon stretch compression stockings. - Provided information on sources for prescription-grade compression stockings [employer]). - Educated regarding the risks and limited efficacy of further vein ablation below the legs. - Discussed possible non-vascular causes, including neuropathic etiologies and restless leg syndrome. - VAS US  LOWER EXTREMITY VENOUS REFLUX; Future  2. Claudication Peripheral vascular disease, under evaluation Persistent symptoms and diminished pulses warrant further vascular assessment to exclude peripheral arterial disease. - Ordered arterial ultrasound of the lower extremities to assess for arterial insufficiency and occlusive disease. - Ordered repeat venous reflux study to evaluate for persistent or recurrent venous reflux. - Will consider neurology referral if findings suggest nerve involvement. - VAS US  ABI WITH/WO TBI; Future   Medications Ordered Prior to Encounter[2]  There are no Patient Instructions on file for this visit. Return for Pt conv ABI and Bil venous reflux, SEE JD ONLY !!!!.   Orvin FORBES Daring, NP      [1] No Known Allergies [2]  Current Outpatient Medications on File Prior to Visit  Medication Sig Dispense Refill   fluorouracil  (EFUDEX ) 5 % cream Apply topically 2 (two) times daily. To right temple until red and irritated, then stop.  May also treat left temple, forehead, frontal scalp, backs of hands. Treat one area at a time. 40 g 0   magnesium oxide (MAG-OX) 400 (240 Mg) MG tablet Take 400 mg by mouth daily.  (Patient taking differently: Take 400 mg by mouth daily. As needed)     Multiple Vitamin (MULTIVITAMIN WITH MINERALS) TABS tablet Take 2 tablets by mouth daily.     Omega-3 Fatty Acids (FISH OIL) 300 MG CAPS Take by mouth 1 day or 1 dose. (Patient taking differently: Take by mouth 1 day or 1 dose. As needed)     sildenafil  (REVATIO ) 20 MG tablet Take 1 tablet (20 mg total) by mouth as needed. Take 1-5 tablets one hour prior to intercourse 30 tablet 2   vitamin B-12 (CYANOCOBALAMIN) 50 MCG tablet Take 50 mcg by mouth daily. (Patient taking differently: Take 50 mcg by mouth daily. As needed)     zinc sulfate, 50mg  elemental zinc, 220 (50 Zn) MG capsule Take 220 mg by mouth daily. (Patient taking differently: Take 220 mg by mouth daily. As needed)     ALPRAZolam  (XANAX ) 0.5 MG tablet Take 1 tab one hour prior to procedure and the 2nd tab when you arrive at our office 2 tablet 0   ALPRAZolam  (XANAX ) 0.5 MG tablet Take 1 tab one hour prior to procedure and the 2nd tab when you arrive at our office 2 tablet 0   tamsulosin  (FLOMAX ) 0.4 MG CAPS capsule Take 1 capsule (0.4 mg total) by mouth daily. (Patient not taking: Reported on 05/07/2024) 90 capsule 0   No current facility-administered medications on file prior to visit.   "

## 2024-06-16 ENCOUNTER — Encounter (INDEPENDENT_AMBULATORY_CARE_PROVIDER_SITE_OTHER)

## 2024-06-16 ENCOUNTER — Ambulatory Visit (INDEPENDENT_AMBULATORY_CARE_PROVIDER_SITE_OTHER): Admitting: Vascular Surgery

## 2024-08-31 ENCOUNTER — Ambulatory Visit: Admitting: Dermatology
